# Patient Record
Sex: Male | Born: 1945 | Race: White | Hispanic: No | Marital: Married | Smoking: Former smoker
Health system: Southern US, Community
[De-identification: ages and names within clinical notes are randomized; demographics above are authoritative.]

## PROBLEM LIST (undated history)

## (undated) DIAGNOSIS — E782 Mixed hyperlipidemia: Secondary | ICD-10-CM

## (undated) DIAGNOSIS — E119 Type 2 diabetes mellitus without complications: Secondary | ICD-10-CM

## (undated) DIAGNOSIS — I10 Essential (primary) hypertension: Secondary | ICD-10-CM

---

## 2012-12-17 ENCOUNTER — Ambulatory Visit: Payer: Self-pay | Admitting: Physical Medicine and Rehabilitation

## 2013-02-04 HISTORY — PX: BACK SURGERY: SHX140

## 2019-01-30 ENCOUNTER — Emergency Department
Admission: EM | Admit: 2019-01-30 | Discharge: 2019-01-30 | Disposition: A | Discharge status: Home / Self Care | Payer: BC Managed Care – PPO | Attending: Emergency Medicine | Admitting: Emergency Medicine

## 2019-01-30 ENCOUNTER — Other Ambulatory Visit: Payer: Self-pay

## 2019-01-30 ENCOUNTER — Encounter: Payer: Self-pay | Admitting: Emergency Medicine

## 2019-01-30 DIAGNOSIS — E119 Type 2 diabetes mellitus without complications: Secondary | ICD-10-CM | POA: Insufficient documentation

## 2019-01-30 DIAGNOSIS — H81393 Other peripheral vertigo, bilateral: Secondary | ICD-10-CM

## 2019-01-30 DIAGNOSIS — I10 Essential (primary) hypertension: Secondary | ICD-10-CM | POA: Insufficient documentation

## 2019-01-30 DIAGNOSIS — R42 Dizziness and giddiness: Secondary | ICD-10-CM | POA: Diagnosis present

## 2019-01-30 HISTORY — DX: Essential (primary) hypertension: I10

## 2019-01-30 HISTORY — DX: Type 2 diabetes mellitus without complications: E11.9

## 2019-01-30 LAB — CBC
HCT: 40.7 % (ref 39.0–52.0)
Hemoglobin: 13.7 g/dL (ref 13.0–17.0)
MCH: 29.7 pg (ref 26.0–34.0)
MCHC: 33.7 g/dL (ref 30.0–36.0)
MCV: 88.3 fL (ref 80.0–100.0)
Platelets: 269 10*3/uL (ref 150–400)
RBC: 4.61 MIL/uL (ref 4.22–5.81)
RDW: 13.2 % (ref 11.5–15.5)
WBC: 8.3 10*3/uL (ref 4.0–10.5)
nRBC: 0 % (ref 0.0–0.2)

## 2019-01-30 LAB — BASIC METABOLIC PANEL
Anion gap: 10 (ref 5–15)
BUN: 15 mg/dL (ref 8–23)
CO2: 26 mmol/L (ref 22–32)
Calcium: 9.2 mg/dL (ref 8.9–10.3)
Chloride: 101 mmol/L (ref 98–111)
Creatinine, Ser: 1.14 mg/dL (ref 0.61–1.24)
GFR calc Af Amer: >60 mL/min (ref >60)
GFR calc non Af Amer: >60 mL/min (ref >60)
Glucose, Bld: 169 mg/dL — ABNORMAL HIGH (ref 70–99)
Potassium: 4.2 mmol/L (ref 3.5–5.1)
Sodium: 137 mmol/L (ref 135–145)

## 2019-01-30 LAB — URINALYSIS, COMPLETE (UACMP) WITH MICROSCOPIC
Bacteria, UA: NONE SEEN
Bilirubin Urine: NEGATIVE
Glucose, UA: NEGATIVE mg/dL
Hgb urine dipstick: NEGATIVE
Ketones, ur: NEGATIVE mg/dL
Leukocytes,Ua: NEGATIVE
Nitrite: NEGATIVE
Protein, ur: NEGATIVE mg/dL
Specific Gravity, Urine: 1.004 — ABNORMAL LOW (ref 1.005–1.030)
Squamous Epithelial / HPF: NONE SEEN (ref 0–5)
pH: 7 (ref 5.0–8.0)

## 2019-01-30 MED ORDER — MECLIZINE HCL 12.5 MG PO TABS: 12.5000 mg | ORAL_TABLET | Freq: Three times a day (TID) | ORAL | 0 refills | Status: DC | PRN

## 2019-01-30 NOTE — Discharge Instructions (Signed)
Your work-up was reassuring.  We started you on a low-dose of meclizine to help with the dizziness.  However he should try to make slower position changes.  You may call ENT to follow-up.  Discussed with your primary care doctor about your difficulties with sleep.

## 2019-01-30 NOTE — ED Provider Notes (Signed)
[facility] Emergency Department Provider Note  ____________________________________________   First MD Initiated Contact with Patient 01/30/19 2019     (approximate)  I have reviewed the triage vital signs and the nursing notes.   HISTORY  Chief Complaint Dizziness    HPI Derek [name] is a 73 y.o. male with diabetes and hypertension who presents with dizziness.  Patient was having some dizziness for the past 2 weeks.  Patient describes the dizziness as coming on when he sits up or gets out of bed fast and it feels like the room is spinning, intermittent lasting few seconds and then goes away.  He also endorses it comes on when he is leaning over.  Being at rest makes it feel better.  He denies any chest pain, shortness of breath, abdominal pain, urinary symptoms.  Denies any difficulties with walking.  He does endorse a lot of stress with working 9 hours a day as well as taking care of his wife who has been battling cancer for multiple years and had a recent hip fracture.  He says he only sleeps 4 hours a night and he feels like sometimes his eyes are just heavy in nature.  He denies any SI.      Past Medical History:  Diagnosis Date  . Diabetes mellitus without complication ([place])   . Hypertension     There are no active problems to display for this patient.   History reviewed. No pertinent surgical history.  Prior to Admission medications   Not on File    Allergies Patient has no known allergies.  No family history on file.  Social History Former smoker, denies alcohol daily  Review of Systems Constitutional: No fever/chills Eyes: No visual changes. ENT: No sore throat.  Positive for dizziness Cardiovascular: Denies chest pain. Respiratory: Denies shortness of breath. Gastrointestinal: No abdominal pain.  No nausea, no vomiting.  No diarrhea.  No constipation. Genitourinary: Negative for dysuria. Musculoskeletal: Negative for back  pain. Skin: Negative for rash. Neurological: Negative for headaches, focal weakness or numbness. All other ROS negative ____________________________________________   PHYSICAL EXAM:  VITAL SIGNS: ED Triage Vitals  Enc Vitals Group     BP 01/30/19 1545 (!) 147/68     Pulse Rate 01/30/19 1545 82     Resp --      Temp 01/30/19 1545 99.5 F (37.5 C)     Temp Source 01/30/19 1545 Oral     SpO2 01/30/19 1545 95 %     Weight 01/30/19 1546 250 lb (113.4 kg)     Height 01/30/19 1546 5\' 9"  (1.753 m)     Head Circumference --      Peak Flow --      Pain Score 01/30/19 1552 0     Pain Loc --      Pain Edu? --      Excl. in [place]? --     Constitutional: Alert and oriented. Well appearing and in no acute distress. Eyes: Conjunctivae are normal. EOMI. Head: Atraumatic.  TMs are clear Nose: No congestion/rhinnorhea. Mouth/Throat: Mucous membranes are moist.   Neck: No stridor. Trachea Midline. FROM Cardiovascular: Normal rate, regular rhythm. Grossly normal heart sounds.  Good peripheral circulation. Respiratory: Normal respiratory effort.  No retractions. Lungs CTAB. Gastrointestinal: Soft and nontender. No distention. No abdominal bruits.  Musculoskeletal: No lower extremity tenderness nor edema.  No joint effusions. Neurologic: Cranial nerves II through XII are intact.  Ambulates normal.  Normal heel-to-shin.  Normal normal finger-to-nose.  Negative Romberg sign Skin:  Skin is warm, dry and intact. No rash noted. Psychiatric: Mood and affect are normal. Speech and behavior are normal. GU: Deferred   ____________________________________________   LABS (all labs ordered are listed, but only abnormal results are displayed)  Labs Reviewed  BASIC METABOLIC PANEL - Abnormal; Notable for the following components:      Result Value   Glucose, Bld 169 (*)    All other components within normal limits  URINALYSIS, COMPLETE (UACMP) WITH MICROSCOPIC - Abnormal; Notable for the following  components:   Color, Urine STRAW (*)    APPearance CLEAR (*)    Specific Gravity, Urine 1.004 (*)    All other components within normal limits  CBC   ____________________________________________   ED ECG REPORT I, Concha SeMary E Kavonte Bearse, the attending physician, personally viewed and interpreted this ECG.  EKG is sinus rate of 80, no ST elevation, no T wave inversion, normal intervals _ PROCEDURES  Procedure(s) performed (including Critical Care):  Procedures   ____________________________________________   INITIAL IMPRESSION / ASSESSMENT AND PLAN / ED COURSE  Derek [name] was evaluated in Emergency Department on 01/30/2019 for the symptoms described in the history of present illness. He was evaluated in the context of the global COVID-19 pandemic, which necessitated consideration that the patient might be at risk for infection with the SARS-CoV-2 virus that causes COVID-19. Institutional protocols and algorithms that pertain to the evaluation of patients at risk for COVID-19 are in a state of rapid change based on information released by regulatory bodies including the CDC and federal and state organizations. These policies and algorithms were followed during the patient's care in the ED.    Patient is a very well-appearing 73 year old who presents with dizziness for 2 weeks that is positional in nature.  Noted to have otitis media on examination.  Low suspicion for ACS given no chest pain not exertional.  Neuro exam is reassuring and low suspicion for posterior stroke.  Will get labs to evaluate for electrolyte abnormalities, UTI.  Patient does endorse significant new stressors in his life and getting a lot less sleep than normal.  This could also be contributing to his symptoms.  Patient denies SI requiring emergent psychiatric consult.  Discussed with patient trying melatonin for sleep and to follow-up with his primary care doctor.  Patient denies any head trauma to suggest bleeds and is not on  any blood thinners.  He has a normal neuro exam so low suspicion for intracranial mass.   Labs are notable for slightly elevated glucose at 169.  White count is normal making infection less likely.  Urine is without evidence of UTI.  We will give patient a short course of meclizine to see if that helps with his symptoms and give ENT referral.  Patient currently denies feeling dizzy at this time.  I discussed the provisional nature of ED diagnosis, the treatment so far, the ongoing plan of care, follow up appointments and return precautions with the patient and any family or support people present. They expressed understanding and agreed with the plan, discharged home.    ____________________________________________   FINAL CLINICAL IMPRESSION(S) / ED DIAGNOSES   Final diagnoses:  Peripheral vertigo of both ears      MEDICATIONS GIVEN DURING THIS VISIT:  Medications - No data to display   ED Discharge Orders         Ordered    meclizine (ANTIVERT) 12.5 MG tablet  3 times daily PRN  01/30/19 2104           Note:  This document was prepared using Dragon voice recognition software and may include unintentional dictation errors.   Concha SeFunke, Timothea Bodenheimer E, MD 01/30/19 2107

## 2019-01-30 NOTE — ED Notes (Signed)
Patient reports feeling "dizzy" headed for the past week.  Reports feels like he isn't sleeping well.  Patient denies any problems with ambulating.  Reports stressors due to wife's health.  ER provider at bedside.

## 2019-01-30 NOTE — ED Triage Notes (Signed)
Pt arrived via POV with reports of dizziness for several days.  Pt reports he has also had some trouble sleeping as well. No distress noted on arrival, pt alert and oriented, ambulatory without assistance.   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

## 2021-04-04 ENCOUNTER — Encounter: Payer: Self-pay | Admitting: Emergency Medicine

## 2021-04-04 ENCOUNTER — Emergency Department: Payer: Medicare HMO

## 2021-04-04 ENCOUNTER — Other Ambulatory Visit: Payer: Self-pay

## 2021-04-04 ENCOUNTER — Emergency Department
Admission: EM | Admit: 2021-04-04 | Discharge: 2021-04-04 | Disposition: A | Discharge status: Home / Self Care | Payer: Medicare HMO | Attending: Emergency Medicine | Admitting: Emergency Medicine

## 2021-04-04 DIAGNOSIS — Z87891 Personal history of nicotine dependence: Secondary | ICD-10-CM | POA: Diagnosis not present

## 2021-04-04 DIAGNOSIS — R42 Dizziness and giddiness: Secondary | ICD-10-CM | POA: Insufficient documentation

## 2021-04-04 DIAGNOSIS — I10 Essential (primary) hypertension: Secondary | ICD-10-CM | POA: Diagnosis not present

## 2021-04-04 DIAGNOSIS — E119 Type 2 diabetes mellitus without complications: Secondary | ICD-10-CM | POA: Insufficient documentation

## 2021-04-04 LAB — CBC WITH DIFFERENTIAL/PLATELET
Abs Immature Granulocytes: 0.04 10*3/uL (ref 0.00–0.07)
Basophils Absolute: 0.1 10*3/uL (ref 0.0–0.1)
Basophils Relative: 1 %
Eosinophils Absolute: 0.2 10*3/uL (ref 0.0–0.5)
Eosinophils Relative: 2 %
HCT: 42.3 % (ref 39.0–52.0)
Hemoglobin: 14.7 g/dL (ref 13.0–17.0)
Immature Granulocytes: 1 %
Lymphocytes Relative: 23 %
Lymphs Abs: 1.9 10*3/uL (ref 0.7–4.0)
MCH: 31.1 pg (ref 26.0–34.0)
MCHC: 34.8 g/dL (ref 30.0–36.0)
MCV: 89.4 fL (ref 80.0–100.0)
Monocytes Absolute: 0.8 10*3/uL (ref 0.1–1.0)
Monocytes Relative: 10 %
Neutro Abs: 5.3 10*3/uL (ref 1.7–7.7)
Neutrophils Relative %: 63 %
Platelets: 245 10*3/uL (ref 150–400)
RBC: 4.73 MIL/uL (ref 4.22–5.81)
RDW: 12.8 % (ref 11.5–15.5)
WBC: 8.4 10*3/uL (ref 4.0–10.5)
nRBC: 0 % (ref 0.0–0.2)

## 2021-04-04 LAB — COMPREHENSIVE METABOLIC PANEL
ALT: 14 U/L (ref 0–44)
AST: 19 U/L (ref 15–41)
Albumin: 4.3 g/dL (ref 3.5–5.0)
Alkaline Phosphatase: 64 U/L (ref 38–126)
Anion gap: 10 (ref 5–15)
BUN: 20 mg/dL (ref 8–23)
CO2: 28 mmol/L (ref 22–32)
Calcium: 9.4 mg/dL (ref 8.9–10.3)
Chloride: 98 mmol/L (ref 98–111)
Creatinine, Ser: 0.85 mg/dL (ref 0.61–1.24)
GFR, Estimated: >60 mL/min (ref >60)
Glucose, Bld: 192 mg/dL — ABNORMAL HIGH (ref 70–99)
Potassium: 3.8 mmol/L (ref 3.5–5.1)
Sodium: 136 mmol/L (ref 135–145)
Total Bilirubin: 0.9 mg/dL (ref 0.3–1.2)
Total Protein: 6.9 g/dL (ref 6.5–8.1)

## 2021-04-04 LAB — TROPONIN I (HIGH SENSITIVITY): Troponin I (High Sensitivity): 7 ng/L (ref <18)

## 2021-04-04 MED ORDER — ONDANSETRON 4 MG PO TBDP: 4.0000 mg | ORAL_TABLET | Freq: Three times a day (TID) | ORAL | 0 refills | Status: DC | PRN

## 2021-04-04 MED ORDER — MECLIZINE HCL 25 MG PO TABS: 25.0000 mg | ORAL_TABLET | Freq: Three times a day (TID) | ORAL | 0 refills | Status: DC | PRN

## 2021-04-04 NOTE — Discharge Instructions (Signed)
1.  You may take medicines as needed for dizziness and nausea (Meclizine/Zofran #20). 2.  Drink plenty of water daily. 3.  Return to the ER for worsening symptoms, persistent vomiting, difficulty breathing or other concerns.

## 2021-04-04 NOTE — ED Provider Notes (Signed)
[facility] Emergency Department Provider Note   ____________________________________________   Event Date/Time   First MD Initiated Contact with Patient 04/04/21 [phone]     (approximate)  I have reviewed the triage vital signs and the nursing notes.   HISTORY  Chief Complaint Dizziness    HPI Derek [name] is a 75 y.o. male who presents to the ED from home with a chief complaint of dizziness.  Patient reports waking up from a nap and feeling like the room was spinning.  Symptoms worsen when he tried to get up and move his head from side to side.  Denies associated fever, vision changes, neck pain, cough, chest pain, shortness of breath, headache, abdominal pain, nausea or vomiting.  Denies recent illness or sinus issues.  Similar symptoms previously.  Admits he does not drink as much water during the day as he should.  He prefers to drink either sweet tea or [employer].     Past Medical History:  Diagnosis Date   Diabetes mellitus without complication (HCC)    Hypertension     There are no problems to display for this patient.   History reviewed. No pertinent surgical history.  Prior to Admission medications   Medication Sig Start Date End Date Taking? Authorizing Provider  meclizine (ANTIVERT) 25 MG tablet Take 1 tablet (25 mg total) by mouth 3 (three) times daily as needed for dizziness or nausea. 04/04/21  Yes Irean Hong, MD  ondansetron (ZOFRAN ODT) 4 MG disintegrating tablet Take 1 tablet (4 mg total) by mouth every 8 (eight) hours as needed for nausea or vomiting. 04/04/21  Yes Irean Hong, MD    Allergies Patient has no known allergies.  No family history on file.  Social History Social History   Tobacco Use   Smoking status: Former   Smokeless tobacco: Never  Building services engineer Use: Never used  Substance Use Topics   Alcohol use: Never   Drug use: Never    Review of Systems  Constitutional: No fever/chills Eyes: No  visual changes. ENT: No sore throat. Cardiovascular: Denies chest pain. Respiratory: Denies shortness of breath. Gastrointestinal: No abdominal pain.  No nausea, no vomiting.  No diarrhea.  No constipation. Genitourinary: Negative for dysuria. Musculoskeletal: Negative for back pain. Skin: Negative for rash. Neurological: Positive for dizziness.  Negative for headaches, focal weakness or numbness.   ____________________________________________   PHYSICAL EXAM:  VITAL SIGNS: ED Triage Vitals  Enc Vitals Group     BP 04/04/21 0115 (!) 141/86     Pulse Rate 04/04/21 0115 84     Resp 04/04/21 0115 18     Temp 04/04/21 0115 98.6 F (37 C)     Temp Source 04/04/21 0115 Oral     SpO2 04/04/21 0115 96 %     Weight 04/04/21 0117 220 lb (99.8 kg)     Height 04/04/21 0117 5\' 9"  (1.753 m)     Head Circumference --      Peak Flow --      Pain Score 04/04/21 0117 0     Pain Loc --      Pain Edu? --      Excl. in [place]? --     Constitutional: Alert and oriented. Well appearing and in no acute distress. Eyes: Conjunctivae are normal. PERRL. EOMI. Head: Atraumatic. Nose: No congestion/rhinnorhea. Mouth/Throat: Mucous membranes are moist.   Neck: No stridor.  No carotid bruits.  Supple neck without meningismus. Cardiovascular: Normal  rate, regular rhythm. Grossly normal heart sounds.  Good peripheral circulation. Respiratory: Normal respiratory effort.  No retractions. Lungs CTAB. Gastrointestinal: Soft and nontender. No distention. No abdominal bruits. No CVA tenderness. Musculoskeletal: No lower extremity tenderness nor edema.  No joint effusions. Neurologic: Alert and oriented x3.  CN II-XII grossly intact.  Normal speech and language.  5/5 motor strength and sensation all extremities.  No gross focal neurologic deficits are appreciated.  Skin:  Skin is warm, dry and intact. No rash noted. Psychiatric: Mood and affect are normal. Speech and behavior are  normal.  ____________________________________________   LABS (all labs ordered are listed, but only abnormal results are displayed)  Labs Reviewed  COMPREHENSIVE METABOLIC PANEL - Abnormal; Notable for the following components:      Result Value   Glucose, Bld 192 (*)    All other components within normal limits  CBC WITH DIFFERENTIAL/PLATELET  URINALYSIS, ROUTINE W REFLEX MICROSCOPIC  TROPONIN I (HIGH SENSITIVITY)  TROPONIN I (HIGH SENSITIVITY)   ____________________________________________  EKG  ED ECG REPORT I, Ledell Codrington J, the attending physician, personally viewed and interpreted this ECG.   Date: 04/04/2021  EKG Time: 0119  Rate: 83  Rhythm: normal sinus rhythm  Axis: Normal  Intervals:none  ST&T Change: Nonspecific  ____________________________________________  RADIOLOGY I, Elnita Surprenant J, personally viewed and evaluated these images (plain radiographs) as part of my medical decision making, as well as reviewing the written report by the radiologist.  ED MD interpretation: No acute ICH, sinus polyp  Official radiology report(s): CT Head Wo Contrast  Result Date: 04/04/2021 CLINICAL DATA:  Vertigo. EXAM: CT HEAD WITHOUT CONTRAST TECHNIQUE: Contiguous axial images were obtained from the base of the skull through the vertex without intravenous contrast. COMPARISON:  None. FINDINGS: Brain: There is mild cerebral atrophy with widening of the extra-axial spaces and ventricular dilatation. There are areas of decreased attenuation within the white matter tracts of the supratentorial brain, consistent with microvascular disease changes. Vascular: No hyperdense vessel or unexpected calcification. Skull: Normal. Negative for fracture or focal lesion. Sinuses/Orbits: A 1.7 cm x 1.1 cm right maxillary sinus polyp versus mucous retention cyst is seen. Other: None. IMPRESSION: 1. Generalized cerebral atrophy. 2. No acute intracranial abnormality. 3. 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 Electronically Signed   By: Aram Candela M.D.   On: 04/04/2021 01:42    ____________________________________________   PROCEDURES  Procedure(s) performed (including Critical Care):  Procedures   ____________________________________________   INITIAL IMPRESSION / ASSESSMENT AND PLAN / ED COURSE  As part of my medical decision making, I reviewed the following data within the electronic MEDICAL RECORD NUMBER History obtained from family, Nursing notes reviewed and incorporated, Labs reviewed, EKG interpreted, Old chart reviewed, Radiograph reviewed, and Notes from prior ED visits     75 year old male presenting with dizziness.  Differential diagnosis includes but is not limited to TIA, CVA, ACS, BPV, infectious, electrolyte abnormalities, etc.  Laboratory results unremarkable.  At the time of our interview and examination, all symptoms have resolved.  Patient feels back to baseline and is eager for discharge home.  Will discharge home with as needed prescriptions for meclizine and Zofran.  Strict return precautions given.  Patient and daughter verbalize understanding agrees with plan of care.      ____________________________________________   FINAL CLINICAL IMPRESSION(S) / ED DIAGNOSES  Final diagnoses:  Dizziness  Vertigo     ED Discharge Orders          Ordered    meclizine (  ANTIVERT) 25 MG tablet  3 times daily PRN        04/04/21 0439    ondansetron (ZOFRAN ODT) 4 MG disintegrating tablet  Every 8 hours PRN        04/04/21 0439             Note:  This document was prepared using Dragon voice recognition software and may include unintentional dictation errors.    Irean Hong, MD 04/04/21 [phone]

## 2021-04-04 NOTE — ED Triage Notes (Signed)
Pt to triage via w/c with no distress noted; reports onset dizziness tonight with no accomp symptoms; denies hx of same; denies any recent illness

## 2021-04-24 ENCOUNTER — Encounter: Payer: Self-pay | Admitting: *Deleted

## 2022-01-08 ENCOUNTER — Encounter: Payer: Self-pay | Admitting: Ophthalmology

## 2022-01-09 ENCOUNTER — Other Ambulatory Visit: Payer: Self-pay

## 2022-01-09 ENCOUNTER — Encounter: Payer: Self-pay | Admitting: Ophthalmology

## 2022-01-09 NOTE — Discharge Instructions (Signed)

## 2022-01-14 ENCOUNTER — Ambulatory Visit: Payer: Medicare HMO | Admitting: Anesthesiology

## 2022-01-14 ENCOUNTER — Other Ambulatory Visit: Payer: Self-pay

## 2022-01-14 ENCOUNTER — Ambulatory Visit
Admission: RE | Admit: 2022-01-14 | Discharge: 2022-01-14 | Disposition: A | Discharge status: Home / Self Care | Payer: Medicare HMO | Source: Ambulatory Visit | Attending: Ophthalmology | Admitting: Ophthalmology

## 2022-01-14 ENCOUNTER — Encounter
Admission: RE | Disposition: A | Discharge status: Home / Self Care | Payer: Self-pay | Source: Ambulatory Visit | Attending: Ophthalmology

## 2022-01-14 DIAGNOSIS — Z6835 Body mass index (BMI) 35.0-35.9, adult: Secondary | ICD-10-CM | POA: Diagnosis not present

## 2022-01-14 DIAGNOSIS — R7303 Prediabetes: Secondary | ICD-10-CM | POA: Diagnosis not present

## 2022-01-14 DIAGNOSIS — H2511 Age-related nuclear cataract, right eye: Secondary | ICD-10-CM | POA: Insufficient documentation

## 2022-01-14 DIAGNOSIS — I10 Essential (primary) hypertension: Secondary | ICD-10-CM | POA: Diagnosis not present

## 2022-01-14 DIAGNOSIS — Z87891 Personal history of nicotine dependence: Secondary | ICD-10-CM | POA: Insufficient documentation

## 2022-01-14 DIAGNOSIS — E669 Obesity, unspecified: Secondary | ICD-10-CM | POA: Insufficient documentation

## 2022-01-14 HISTORY — PX: CATARACT EXTRACTION W/PHACO: SHX586

## 2022-01-14 HISTORY — DX: Mixed hyperlipidemia: E78.2

## 2022-01-14 SURGERY — PHACOEMULSIFICATION, CATARACT, WITH IOL INSERTION
Anesthesia: Monitor Anesthesia Care | Site: Eye | Laterality: Right

## 2022-01-14 MED ORDER — SIGHTPATH DOSE#1 BSS IO SOLN
INTRAOCULAR | Status: DC | PRN
  Administered 2022-01-14: 138 mL via OPHTHALMIC

## 2022-01-14 MED ORDER — LACTATED RINGERS IV SOLN: INTRAVENOUS | Status: DC

## 2022-01-14 MED ORDER — SIGHTPATH DOSE#1 BSS IO SOLN
INTRAOCULAR | Status: DC | PRN
  Administered 2022-01-14: 15 mL

## 2022-01-14 MED ORDER — MOXIFLOXACIN HCL 0.5 % OP SOLN
OPHTHALMIC | Status: DC | PRN
  Administered 2022-01-14: 0.2 mL via OPHTHALMIC

## 2022-01-14 MED ORDER — FENTANYL CITRATE (PF) 100 MCG/2ML IJ SOLN
INTRAMUSCULAR | Status: DC | PRN
  Administered 2022-01-14: 50 ug via INTRAVENOUS

## 2022-01-14 MED ORDER — SIGHTPATH DOSE#1 NA HYALUR & NA CHOND-NA HYALUR IO KIT
PACK | INTRAOCULAR | Status: DC | PRN
  Administered 2022-01-14: 1 via OPHTHALMIC

## 2022-01-14 MED ORDER — BRIMONIDINE TARTRATE-TIMOLOL 0.2-0.5 % OP SOLN
OPHTHALMIC | Status: DC | PRN
  Administered 2022-01-14: 1 [drp] via OPHTHALMIC

## 2022-01-14 MED ORDER — ARMC OPHTHALMIC DILATING DROPS
1.0000 | OPHTHALMIC | Status: DC | PRN
  Administered 2022-01-14 (×3): 1 via OPHTHALMIC

## 2022-01-14 MED ORDER — TETRACAINE HCL 0.5 % OP SOLN
1.0000 [drp] | OPHTHALMIC | Status: DC | PRN
Start: 2022-01-14 — End: 2022-01-14
  Administered 2022-01-14 (×3): 1 [drp] via OPHTHALMIC

## 2022-01-14 MED ORDER — MIDAZOLAM HCL 2 MG/2ML IJ SOLN
INTRAMUSCULAR | Status: DC | PRN
  Administered 2022-01-14 (×2): 1 mg via INTRAVENOUS

## 2022-01-14 MED ORDER — LIDOCAINE HCL (PF) 2 % IJ SOLN
INTRAOCULAR | Status: DC | PRN
  Administered 2022-01-14: 1 mL via INTRAOCULAR

## 2022-01-14 SURGICAL SUPPLY — 23 items
CANNULA ANT/CHMB 27G (MISCELLANEOUS) IMPLANT
CANNULA ANT/CHMB 27GA (MISCELLANEOUS) IMPLANT
CATARACT SUITE SIGHTPATH (MISCELLANEOUS) ×2 IMPLANT
DISSECTOR HYDRO NUCLEUS 50X22 (MISCELLANEOUS) ×2 IMPLANT
DRSG TEGADERM 2-3/8X2-3/4 SM (GAUZE/BANDAGES/DRESSINGS) ×2 IMPLANT
FEE CATARACT SUITE SIGHTPATH (MISCELLANEOUS) ×1 IMPLANT
GLOVE SURG SYN 7.5  E (GLOVE) ×1
GLOVE SURG SYN 7.5 E (GLOVE) ×1 IMPLANT
GLOVE SURG SYN 7.5 PF PI (GLOVE) ×1 IMPLANT
GLOVE SURG SYN 8.5  E (GLOVE) ×1
GLOVE SURG SYN 8.5 E (GLOVE) ×1 IMPLANT
GLOVE SURG SYN 8.5 PF PI (GLOVE) ×1 IMPLANT
LENS IOL TECNIS EYHANCE 25.0 (Intraocular Lens) ×1 IMPLANT
NDL FILTER BLUNT 18X1 1/2 (NEEDLE) IMPLANT
NEEDLE FILTER BLUNT 18X 1/2SAF (NEEDLE)
NEEDLE FILTER BLUNT 18X1 1/2 (NEEDLE) IMPLANT
PACK VIT ANT 23G (MISCELLANEOUS) IMPLANT
RING MALYGIN (MISCELLANEOUS) IMPLANT
SUT ETHILON 10-0 CS-B-6CS-B-6 (SUTURE)
SUTURE EHLN 10-0 CS-B-6CS-B-6 (SUTURE) IMPLANT
SYR 3ML LL SCALE MARK (SYRINGE) IMPLANT
SYR 5ML LL (SYRINGE) IMPLANT
WATER STERILE IRR 250ML POUR (IV SOLUTION) ×2 IMPLANT

## 2022-01-14 NOTE — Transfer of Care (Signed)
Immediate Anesthesia Transfer of Care Note  Patient: Derek [name]  Procedure(s) Performed: CATARACT EXTRACTION PHACO AND INTRAOCULAR LENS PLACEMENT (IOC) RIGHT DIABETIC (Right: Eye)  Patient Location: PACU  Anesthesia Type: MAC  Level of Consciousness: awake, alert  and patient cooperative  Airway and Oxygen Therapy: Patient Spontanous Breathing and Patient connected to supplemental oxygen  Post-op Assessment: Post-op Vital signs reviewed, Patient's Cardiovascular Status Stable, Respiratory Function Stable, Patent Airway and No signs of Nausea or vomiting  Post-op Vital Signs: Reviewed and stable  Complications: No notable events documented.

## 2022-01-14 NOTE — Anesthesia Preprocedure Evaluation (Signed)
Anesthesia Evaluation  Patient identified by MRN, date of birth, ID band Patient awake    Reviewed: Allergy & Precautions, NPO status , Patient's Chart, lab work & pertinent test results  Airway Mallampati: II  TM Distance: >3 FB Neck ROM: Full    Dental no notable dental hx.    Pulmonary former smoker,    Pulmonary exam normal        Cardiovascular hypertension, Normal cardiovascular exam     Neuro/Psych negative psych ROS   GI/Hepatic negative GI ROS, Neg liver ROS,   Endo/Other  diabetes (Patient states prediabetes, not taking medications)BMI 35  Renal/GU      Musculoskeletal negative musculoskeletal ROS (+)   Abdominal (+) + obese,   Peds  Hematology negative hematology ROS (+)   Anesthesia Other Findings   Reproductive/Obstetrics                             Anesthesia Physical Anesthesia Plan  ASA: 2  Anesthesia Plan: MAC   Post-op Pain Management: Minimal or no pain anticipated   Induction: Intravenous  PONV Risk Score and Plan: 1 and TIVA, Midazolam and Treatment may vary due to age or medical condition  Airway Management Planned: Natural Airway and Nasal Cannula  Additional Equipment: None  Intra-op Plan:   Post-operative Plan:   Informed Consent: I have reviewed the patients History and Physical, chart, labs and discussed the procedure including the risks, benefits and alternatives for the proposed anesthesia with the patient or authorized representative who has indicated his/her understanding and acceptance.     Dental advisory given  Plan Discussed with: CRNA  Anesthesia Plan Comments:         Anesthesia Quick Evaluation

## 2022-01-14 NOTE — Anesthesia Postprocedure Evaluation (Signed)
Anesthesia Post Note  Patient: Derek [name]  Procedure(s) Performed: CATARACT EXTRACTION PHACO AND INTRAOCULAR LENS PLACEMENT (IOC) RIGHT DIABETIC (Right: Eye)     Patient location during evaluation: PACU Anesthesia Type: MAC Level of consciousness: awake and alert Pain management: pain level controlled Vital Signs Assessment: post-procedure vital signs reviewed and stable Respiratory status: spontaneous breathing and nonlabored ventilation Cardiovascular status: blood pressure returned to baseline Postop Assessment: no apparent nausea or vomiting Anesthetic complications: no   No notable events documented.  Jeziel Hoffmann [employer]

## 2022-01-14 NOTE — Op Note (Signed)
OPERATIVE NOTE  Derek [name] [id number] 01/14/2022   PREOPERATIVE DIAGNOSIS: Nuclear sclerotic cataract right eye. H25.11   POSTOPERATIVE DIAGNOSIS: Nuclear sclerotic cataract right eye. H25.11   PROCEDURE:  Phacoemusification with posterior chamber intraocular lens placement of the right eye  Ultrasound time: Procedure(s) with comments: CATARACT EXTRACTION PHACO AND INTRAOCULAR LENS PLACEMENT (IOC) RIGHT DIABETIC (Right) - 27.29 2:44.3  LENS:   Implant Name Type Inv. Item Serial No. Manufacturer Lot No. LRB No. Used Action  LENS IOL TECNIS EYHANCE 25.0 - [id number] Intraocular Lens LENS IOL TECNIS EYHANCE 25.0 [id number] [employer]  Right 1 Implanted      SURGEON:  Julious Payer. Rolley Sims, MD   ANESTHESIA:  Topical with tetracaine drops, augmented with 1% preservative-free intracameral lidocaine.   COMPLICATIONS:  None.   DESCRIPTION OF PROCEDURE:  The patient was identified in the holding room and transported to the operating room and placed in the supine position under the operating microscope.  The right eye was identified as the operative eye, which was prepped and draped in the usual sterile ophthalmic fashion.   A 1 millimeter clear-corneal paracentesis was made superotemporally. Preservative-free 1% lidocaine mixed with 1:1,000 bisulfite-free aqueous solution of epinephrine was injected into the anterior chamber. The anterior chamber was then filled with Viscoat viscoelastic. A 2.4 millimeter keratome was used to make a clear-corneal incision inferotemporally. A curvilinear capsulorrhexis was made with a cystotome and capsulorrhexis forceps. Balanced salt solution was used to hydrodissect and hydrodelineate the nucleus. Phacoemulsification was then used to remove the lens nucleus and epinucleus. The remaining cortex was then removed using the irrigation and aspiration handpiece. Provisc was then placed into the capsular bag to distend it for lens placement. A +25.00 DIB00 intraocular lens  was then injected into the capsular bag. The remaining viscoelastic was aspirated.   Wounds were hydrated with balanced salt solution.  The anterior chamber was inflated to a physiologic pressure with balanced salt solution.  No wound leaks were noted. Vigamox was injected intracamerally.  Timolol and Brimonidine drops were applied to the eye.  The patient was taken to the recovery room in stable condition without complications of anesthesia or surgery.  [name] 01/14/2022, 8:05 AM

## 2022-01-14 NOTE — Anesthesia Procedure Notes (Signed)
Procedure Name: MAC Date/Time: 01/14/2022 7:27 AM  Performed by: Dionne Bucy, CRNAPre-anesthesia Checklist: Patient identified, Emergency Drugs available, Suction available, Patient being monitored and Timeout performed Patient Re-evaluated:Patient Re-evaluated prior to induction Oxygen Delivery Method: Nasal cannula Placement Confirmation: positive ETCO2

## 2022-01-14 NOTE — H&P (Signed)
[facility] [place]   Primary Care Physician:  Marisue Ivan, MD Ophthalmologist: Dr. Deberah Pelton  Pre-Procedure History & Physical: HPI:  Derek [name] is a 76 y.o. male here for cataract surgery.   Past Medical History:  Diagnosis Date   Diabetes mellitus without complication (HCC)    Hypertension    Mixed hyperlipidemia     Past Surgical History:  Procedure Laterality Date   BACK SURGERY  02/04/2013   L1-L2    Prior to Admission medications   Medication Sig Start Date End Date Taking? Authorizing Provider  aspirin EC 81 MG tablet Take 81 mg by mouth daily. Swallow whole.   Yes [provider]  atorvastatin (LIPITOR) 40 MG tablet Take 40 mg by mouth daily.   Yes [provider]  Garlic 100 MG TABS Take by mouth.   Yes [provider]  lisinopril-hydrochlorothiazide (ZESTORETIC) 20-25 MG tablet Take 1 tablet by mouth daily.   Yes [provider]  meclizine (ANTIVERT) 25 MG tablet Take 1 tablet (25 mg total) by mouth 3 (three) times daily as needed for dizziness or nausea. 04/04/21  Yes Irean Hong, MD  Multiple Vitamin (MULTIVITAMIN) capsule Take 1 capsule by mouth daily.   Yes [provider]  ondansetron (ZOFRAN ODT) 4 MG disintegrating tablet Take 1 tablet (4 mg total) by mouth every 8 (eight) hours as needed for nausea or vomiting. 04/04/21  Yes Irean Hong, MD  pyridOXINE (VITAMIN B-6) 100 MG tablet Take 100 mg by mouth daily.   Yes [provider]  glipiZIDE (GLUCOTROL) 5 MG tablet Take 5 mg by mouth daily before breakfast. Patient not taking: Reported on 01/14/2022    [provider]    Allergies as of 11/09/2021   (No Known Allergies)    History reviewed. No pertinent family history.  Social History   Socioeconomic History   Marital status: Married    Spouse name: Not on file   Number of children: Not on file   Years of education: Not on file   Highest education level: Not on file   Occupational History   Not on file  Tobacco Use   Smoking status: Former   Smokeless tobacco: Never  Vaping Use   Vaping Use: Never used  Substance and Sexual Activity   Alcohol use: Never   Drug use: Never   Sexual activity: Not on file  Other Topics Concern   Not on file  Social History Narrative   Not on file   Social Determinants of Health   Financial Resource Strain: Not on file  Food Insecurity: Not on file  Transportation Needs: Not on file  Physical Activity: Not on file  Stress: Not on file  Social Connections: Not on file  Intimate Partner Violence: Not on file    Review of Systems: See HPI, otherwise negative ROS  Physical Exam: BP (!) 143/78   Pulse 72   Temp 97.7 F (36.5 C) (Temporal)   Resp 16   Ht 5\' 9"  (1.753 m)   Wt 109.8 kg   SpO2 96%   BMI 35.74 kg/m  General:   Alert, cooperative in NAD Head:  Normocephalic and atraumatic. Respiratory:  Normal work of breathing. Cardiovascular:  RRR  Impression/Plan: is here for cataract surgery.  Risks, benefits, limitations, and alternatives regarding cataract surgery have been reviewed with the patient.  Questions have been answered.  All parties agreeable.   [name], MD  01/14/2022, 7:16 AM

## 2022-01-15 ENCOUNTER — Encounter: Payer: Self-pay | Admitting: Ophthalmology

## 2022-01-17 ENCOUNTER — Encounter: Payer: Self-pay | Admitting: Ophthalmology

## 2022-01-22 NOTE — Discharge Instructions (Signed)

## 2022-01-24 ENCOUNTER — Encounter
Admission: RE | Disposition: A | Discharge status: Home / Self Care | Payer: Self-pay | Source: Home / Self Care | Attending: Ophthalmology

## 2022-01-24 ENCOUNTER — Ambulatory Visit: Payer: Medicare HMO | Admitting: Anesthesiology

## 2022-01-24 ENCOUNTER — Ambulatory Visit
Admission: RE | Admit: 2022-01-24 | Discharge: 2022-01-24 | Disposition: A | Discharge status: Home / Self Care | Payer: Medicare HMO | Attending: Ophthalmology | Admitting: Ophthalmology

## 2022-01-24 ENCOUNTER — Ambulatory Visit (AMBULATORY_SURGERY_CENTER): Payer: Medicare HMO | Admitting: Anesthesiology

## 2022-01-24 ENCOUNTER — Other Ambulatory Visit: Payer: Self-pay

## 2022-01-24 ENCOUNTER — Encounter: Payer: Self-pay | Admitting: Ophthalmology

## 2022-01-24 DIAGNOSIS — I10 Essential (primary) hypertension: Secondary | ICD-10-CM

## 2022-01-24 DIAGNOSIS — Z87891 Personal history of nicotine dependence: Secondary | ICD-10-CM

## 2022-01-24 DIAGNOSIS — E1136 Type 2 diabetes mellitus with diabetic cataract: Secondary | ICD-10-CM | POA: Diagnosis not present

## 2022-01-24 DIAGNOSIS — E119 Type 2 diabetes mellitus without complications: Secondary | ICD-10-CM

## 2022-01-24 DIAGNOSIS — H2512 Age-related nuclear cataract, left eye: Secondary | ICD-10-CM | POA: Insufficient documentation

## 2022-01-24 HISTORY — PX: CATARACT EXTRACTION W/PHACO: SHX586

## 2022-01-24 LAB — GLUCOSE, CAPILLARY: Glucose-Capillary: 189 mg/dL — ABNORMAL HIGH (ref 70–99)

## 2022-01-24 SURGERY — PHACOEMULSIFICATION, CATARACT, WITH IOL INSERTION
Anesthesia: Monitor Anesthesia Care | Site: Eye | Laterality: Left

## 2022-01-24 MED ORDER — BRIMONIDINE TARTRATE-TIMOLOL 0.2-0.5 % OP SOLN
OPHTHALMIC | Status: DC | PRN
  Administered 2022-01-24: 1 [drp] via OPHTHALMIC

## 2022-01-24 MED ORDER — LACTATED RINGERS IV SOLN: INTRAVENOUS | Status: DC

## 2022-01-24 MED ORDER — SIGHTPATH DOSE#1 BSS IO SOLN
INTRAOCULAR | Status: DC | PRN
  Administered 2022-01-24: 91 mL via OPHTHALMIC

## 2022-01-24 MED ORDER — ARMC OPHTHALMIC DILATING DROPS
1.0000 | OPHTHALMIC | Status: DC | PRN
  Administered 2022-01-24 (×3): 1 via OPHTHALMIC

## 2022-01-24 MED ORDER — MOXIFLOXACIN HCL 0.5 % OP SOLN
OPHTHALMIC | Status: DC | PRN
  Administered 2022-01-24: 0.2 mL via OPHTHALMIC

## 2022-01-24 MED ORDER — TETRACAINE HCL 0.5 % OP SOLN
1.0000 [drp] | OPHTHALMIC | Status: DC | PRN
Start: 2022-01-24 — End: 2022-01-24
  Administered 2022-01-24 (×3): 1 [drp] via OPHTHALMIC

## 2022-01-24 MED ORDER — LIDOCAINE HCL (PF) 2 % IJ SOLN
INTRAOCULAR | Status: DC | PRN
  Administered 2022-01-24: 4 mL via INTRAOCULAR

## 2022-01-24 MED ORDER — SIGHTPATH DOSE#1 NA HYALUR & NA CHOND-NA HYALUR IO KIT
PACK | INTRAOCULAR | Status: DC | PRN
  Administered 2022-01-24: 1 via OPHTHALMIC

## 2022-01-24 MED ORDER — SIGHTPATH DOSE#1 BSS IO SOLN
INTRAOCULAR | Status: DC | PRN
  Administered 2022-01-24: 15 mL via INTRAOCULAR

## 2022-01-24 MED ORDER — FENTANYL CITRATE (PF) 100 MCG/2ML IJ SOLN
INTRAMUSCULAR | Status: DC | PRN
  Administered 2022-01-24: 50 ug via INTRAVENOUS

## 2022-01-24 MED ORDER — MIDAZOLAM HCL 2 MG/2ML IJ SOLN
INTRAMUSCULAR | Status: DC | PRN
  Administered 2022-01-24: 1 mg via INTRAVENOUS

## 2022-01-24 SURGICAL SUPPLY — 14 items
CANNULA ANT/CHMB 27G (MISCELLANEOUS) IMPLANT
CANNULA ANT/CHMB 27GA (MISCELLANEOUS) IMPLANT
CATARACT SUITE SIGHTPATH (MISCELLANEOUS) ×2 IMPLANT
DISSECTOR HYDRO NUCLEUS 50X22 (MISCELLANEOUS) ×2 IMPLANT
DRSG TEGADERM 2-3/8X2-3/4 SM (GAUZE/BANDAGES/DRESSINGS) ×2 IMPLANT
FEE CATARACT SUITE SIGHTPATH (MISCELLANEOUS) ×1 IMPLANT
GLOVE SURG SYN 7.5  E (GLOVE) ×1
GLOVE SURG SYN 7.5 E (GLOVE) ×1 IMPLANT
GLOVE SURG SYN 7.5 PF PI (GLOVE) ×1 IMPLANT
GLOVE SURG SYN 8.5  E (GLOVE) ×1
GLOVE SURG SYN 8.5 E (GLOVE) ×1 IMPLANT
GLOVE SURG SYN 8.5 PF PI (GLOVE) ×1 IMPLANT
LENS IOL TECNIS EYHANCE 25.5 (Intraocular Lens) ×1 IMPLANT
WATER STERILE IRR 250ML POUR (IV SOLUTION) ×2 IMPLANT

## 2022-01-24 NOTE — Op Note (Signed)
OPERATIVE NOTE  Derek [name] [id number] 01/24/2022   PREOPERATIVE DIAGNOSIS: Nuclear sclerotic cataract left eye. H25.12   POSTOPERATIVE DIAGNOSIS: Nuclear sclerotic cataract left eye. H25.12   PROCEDURE:  Phacoemusification with posterior chamber intraocular lens placement of the left eye  Ultrasound time: Procedure(s): CATARACT EXTRACTION PHACO AND INTRAOCULAR LENS PLACEMENT (IOC) LEFT DIABETIC 19.04 01:44.7 (Left)  LENS:   Implant Name Type Inv. Item Serial No. Manufacturer Lot No. LRB No. Used Action  LENS IOL TECNIS EYHANCE 25.5 - [id number] Intraocular Lens LENS IOL TECNIS EYHANCE 25.5 [id number] [employer]  Left 1 Implanted      SURGEON:  Julious Payer. Rolley Sims, MD   ANESTHESIA:  Topical with tetracaine drops, augmented with 1% preservative-free intracameral lidocaine.   COMPLICATIONS:  None.   DESCRIPTION OF PROCEDURE:  The patient was identified in the holding room and transported to the operating room and placed in the supine position under the operating microscope.  The left eye was identified as the operative eye, which was prepped and draped in the usual sterile ophthalmic fashion.   A 1 millimeter clear-corneal paracentesis was made inferotemporally. Preservative-free 1% lidocaine mixed with 1:1,000 bisulfite-free aqueous solution of epinephrine was injected into the anterior chamber. The anterior chamber was then filled with Viscoat viscoelastic. A 2.4 millimeter keratome was used to make a clear-corneal incision superotemporally. A curvilinear capsulorrhexis was made with a cystotome and capsulorrhexis forceps. Balanced salt solution was used to hydrodissect and hydrodelineate the nucleus. Phacoemulsification was then used to remove the lens nucleus and epinucleus. The remaining cortex was then removed using the irrigation and aspiration handpiece. Provisc was then placed into the capsular bag to distend it for lens placement. A +25.50 DIB00 intraocular lens was then injected into  the capsular bag. The remaining viscoelastic was aspirated.   Wounds were hydrated with balanced salt solution.  The anterior chamber was inflated to a physiologic pressure with balanced salt solution.  No wound leaks were noted. Vigamox was injected intracamerally.  Timolol and Brimonidine drops were applied to the eye.  The patient was taken to the recovery room in stable condition without complications of anesthesia or surgery.  [name] 01/24/2022, 7:56 AM

## 2022-01-24 NOTE — Anesthesia Postprocedure Evaluation (Signed)
Anesthesia Post Note  Patient: Derek [name]  Procedure(s) Performed: CATARACT EXTRACTION PHACO AND INTRAOCULAR LENS PLACEMENT (IOC) LEFT DIABETIC 19.04 01:44.7 (Left: Eye)     Patient location during evaluation: PACU Anesthesia Type: MAC Level of consciousness: awake and alert Pain management: pain level controlled Vital Signs Assessment: post-procedure vital signs reviewed and stable Respiratory status: spontaneous breathing, nonlabored ventilation, respiratory function stable and patient connected to nasal cannula oxygen Cardiovascular status: stable and blood pressure returned to baseline Postop Assessment: no apparent nausea or vomiting Anesthetic complications: no   No notable events documented.  Adele Barthel Kevante Lunt

## 2022-01-24 NOTE — H&P (Signed)
[facility]   Primary Care Physician:  Marisue Ivan, MD Ophthalmologist: Dr. Deberah Pelton  Pre-Procedure History & Physical: HPI:  Derek [name] is a 76 y.o. male here for cataract surgery.   Past Medical History:  Diagnosis Date   Diabetes mellitus without complication (HCC)    Hypertension    Mixed hyperlipidemia     Past Surgical History:  Procedure Laterality Date   BACK SURGERY  02/04/2013   L1-L2   CATARACT EXTRACTION W/PHACO Right 01/14/2022   Procedure: CATARACT EXTRACTION PHACO AND INTRAOCULAR LENS PLACEMENT (IOC) RIGHT DIABETIC;  Surgeon: Estanislado Pandy, MD;  Location: [facility];  Service: Ophthalmology;  Laterality: Right;  27.29 2:44.3    Prior to Admission medications   Medication Sig Start Date End Date Taking? Authorizing Provider  ascorbic acid (VITAMIN C) 500 MG tablet Take 500 mg by mouth daily.   Yes [provider]  aspirin EC 81 MG tablet Take 81 mg by mouth daily. Swallow whole.   Yes [provider]  atorvastatin (LIPITOR) 40 MG tablet Take 40 mg by mouth daily.   Yes [provider]  Cholecalciferol (VITAMIN D3 PO) Take by mouth daily.   Yes [provider]  Garlic 100 MG TABS Take by mouth.   Yes [provider]  lisinopril-hydrochlorothiazide (ZESTORETIC) 20-25 MG tablet Take 1 tablet by mouth daily. 10-12.5   Yes [provider]  Misc Natural Products (BLOOD SUGAR 360) CAPS Take by mouth daily.   Yes [provider]  Multiple Vitamin (MULTIVITAMIN) capsule Take 1 capsule by mouth daily.   Yes [provider]  Omega-3 Fatty Acids (FISH OIL PO) Take by mouth daily.   Yes [provider]  pyridOXINE (VITAMIN B-6) 100 MG tablet Take 100 mg by mouth daily.   Yes [provider]  glipiZIDE (GLUCOTROL) 5 MG tablet Take 5 mg by mouth daily before breakfast. Patient not taking: Reported on 01/14/2022    [provider]  meclizine  (ANTIVERT) 25 MG tablet Take 1 tablet (25 mg total) by mouth 3 (three) times daily as needed for dizziness or nausea. Patient not taking: Reported on 01/17/2022 04/04/21   Irean Hong, MD  ondansetron (ZOFRAN ODT) 4 MG disintegrating tablet Take 1 tablet (4 mg total) by mouth every 8 (eight) hours as needed for nausea or vomiting. Patient not taking: Reported on 01/17/2022 04/04/21   Irean Hong, MD    Allergies as of 11/09/2021   (No Known Allergies)    History reviewed. No pertinent family history.  Social History   Socioeconomic History   Marital status: Married    Spouse name: Not on file   Number of children: Not on file   Years of education: Not on file   Highest education level: Not on file  Occupational History   Not on file  Tobacco Use   Smoking status: Former    Types: Cigarettes   Smokeless tobacco: Never   Tobacco comments:    "Quit when they got to 50 cents/pack"  Vaping Use   Vaping Use: Never used  Substance and Sexual Activity   Alcohol use: Not Currently   Drug use: Never   Sexual activity: Not on file  Other Topics Concern   Not on file  Social History Narrative   Not on file   Social Determinants of Health   Financial Resource Strain: Not on file  Food Insecurity: Not on file  Transportation Needs: Not on file  Physical Activity:  Not on file  Stress: Not on file  Social Connections: Not on file  Intimate Partner Violence: Not on file    Review of Systems: See HPI, otherwise negative ROS  Physical Exam: BP (!) 162/91   Pulse 78   Temp 97.7 F (36.5 C)   Ht 5\' 9"  (1.753 m)   Wt 109.3 kg   SpO2 96%   BMI 35.59 kg/m  General:   Alert, cooperative in NAD Head:  Normocephalic and atraumatic. Respiratory:  Normal work of breathing. Cardiovascular:  RRR  Impression/Plan: is here for cataract surgery.  Risks, benefits, limitations, and alternatives regarding cataract surgery have been reviewed with the patient.  Questions  have been answered.  All parties agreeable.   [name], MD  01/24/2022, 7:03 AM

## 2022-01-24 NOTE — Transfer of Care (Signed)
Immediate Anesthesia Transfer of Care Note  Patient: Derek [name]  Procedure(s) Performed: CATARACT EXTRACTION PHACO AND INTRAOCULAR LENS PLACEMENT (IOC) LEFT DIABETIC 19.04 01:44.7 (Left: Eye)  Patient Location: PACU  Anesthesia Type:MAC  Level of Consciousness: awake, alert  and oriented  Airway & Oxygen Therapy: Patient Spontanous Breathing  Post-op Assessment: Report given to RN and Post -op Vital signs reviewed and stable  Post vital signs: Reviewed and stable  Last Vitals:  Vitals Value Taken Time  BP 138/84 01/24/22 0758  Temp 36.3 C 01/24/22 0758  Pulse 83 01/24/22 0800  Resp 22 01/24/22 0800  SpO2 95 % 01/24/22 0800  Vitals shown include unvalidated device data.  Last Pain:  Vitals:   01/24/22 0758  PainSc: 0-No pain         Complications: No notable events documented.

## 2022-01-24 NOTE — Anesthesia Preprocedure Evaluation (Signed)
Anesthesia Evaluation  Patient identified by MRN, date of birth, ID band Patient awake    History of Anesthesia Complications Negative for: history of anesthetic complications  Airway Mallampati: III  TM Distance: >3 FB Neck ROM: Full    Dental no notable dental hx.    Pulmonary neg pulmonary ROS, former smoker,    Pulmonary exam normal        Cardiovascular Exercise Tolerance: Good hypertension, Pt. on medications Normal cardiovascular exam     Neuro/Psych negative neurological ROS     GI/Hepatic negative GI ROS, Neg liver ROS,   Endo/Other  diabetes, Type 2  Renal/GU negative Renal ROS     Musculoskeletal   Abdominal   Peds  Hematology   Anesthesia Other Findings   Reproductive/Obstetrics                             Anesthesia Physical Anesthesia Plan  ASA: 2  Anesthesia Plan: MAC   Post-op Pain Management: Minimal or no pain anticipated   Induction:   PONV Risk Score and Plan: 0 and Midazolam, Treatment may vary due to age or medical condition and TIVA  Airway Management Planned: Nasal Cannula and Natural Airway  Additional Equipment: None  Intra-op Plan:   Post-operative Plan:   Informed Consent: I have reviewed the patients History and Physical, chart, labs and discussed the procedure including the risks, benefits and alternatives for the proposed anesthesia with the patient or authorized representative who has indicated his/her understanding and acceptance.       Plan Discussed with: CRNA  Anesthesia Plan Comments:         Anesthesia Quick Evaluation

## 2022-01-25 ENCOUNTER — Encounter: Payer: Self-pay | Admitting: Ophthalmology

## 2022-03-25 IMAGING — CT CT HEAD W/O CM
3 series · 16 of 47 positions shown, 19 images · non-contrast
Comparison: None.

CLINICAL DATA: Vertigo.

EXAM:
CT HEAD WITHOUT CONTRAST
TECHNIQUE: Contiguous axial images were obtained from the base of the skull
through the vertex without intravenous contrast.

[Series 2: head wo · axial · 0.44mm/px · z∈[-70,+75]mm · 10 of 35 slices shown, 13 images]
[im 3/35  brain]
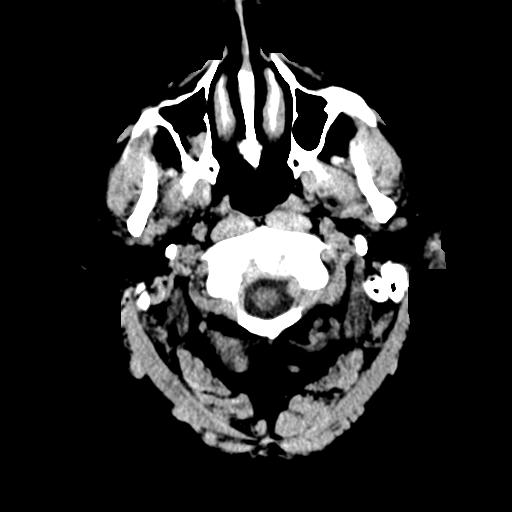
[im 3/35  bone]
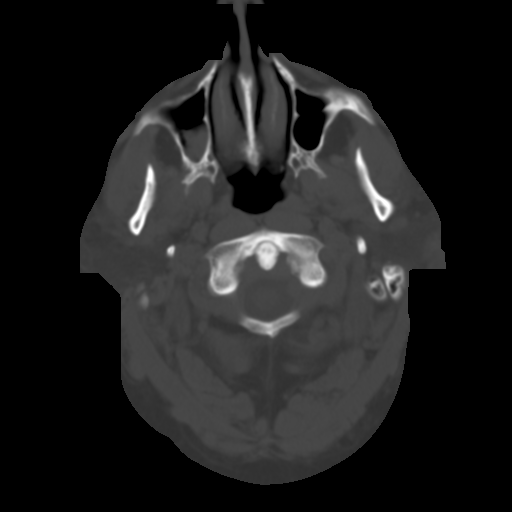
[im 6/35  brain]
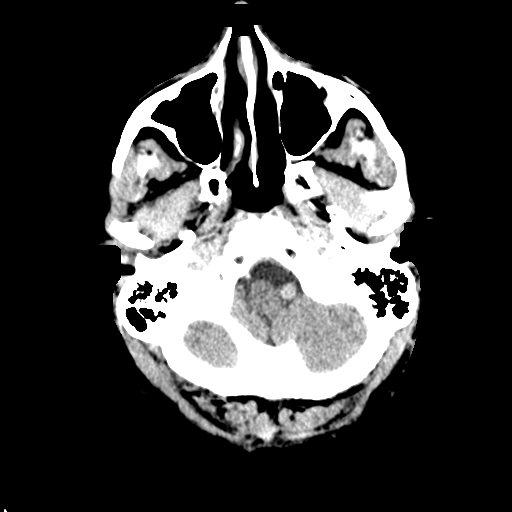
[im 10/35  brain]
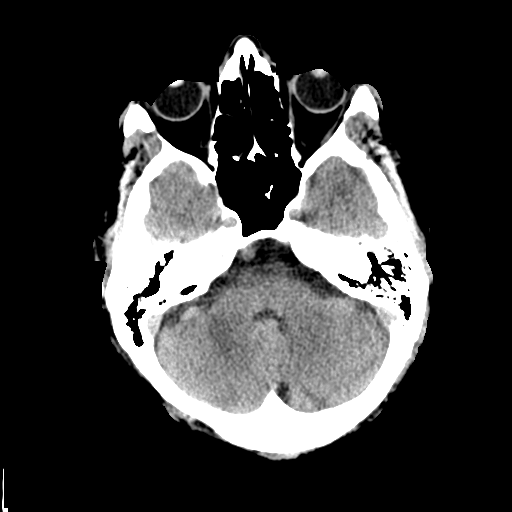
[im 12/35  brain]
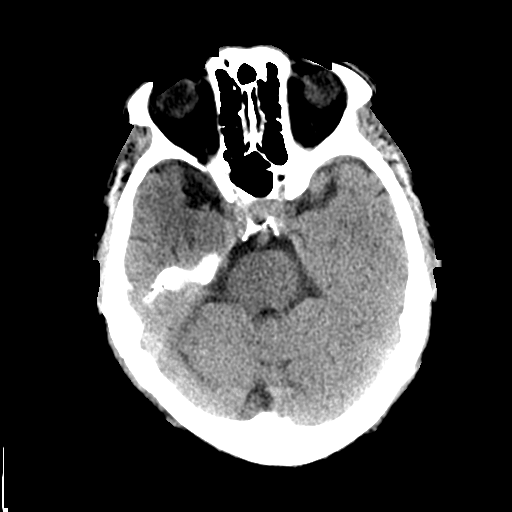
[im 16/35  brain]
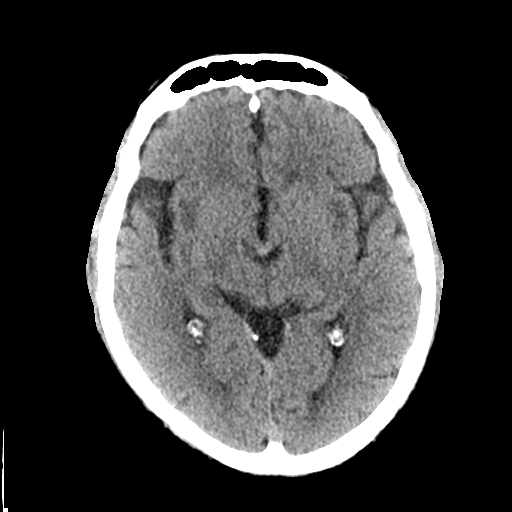
[im 16/35  bone]
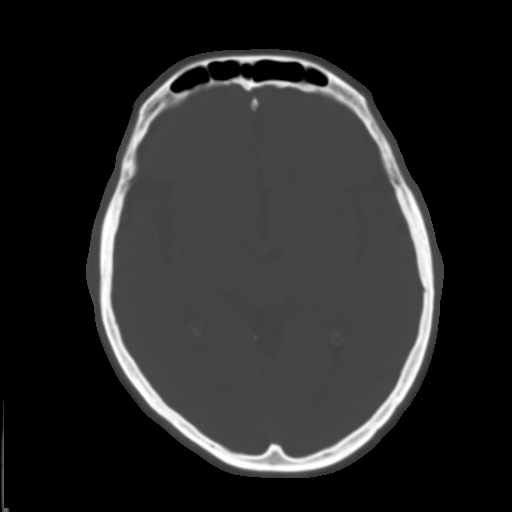
[im 19/35  brain]
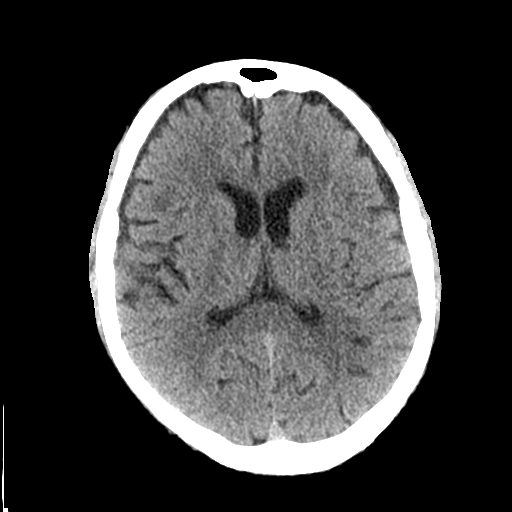
[im 23/35  brain]
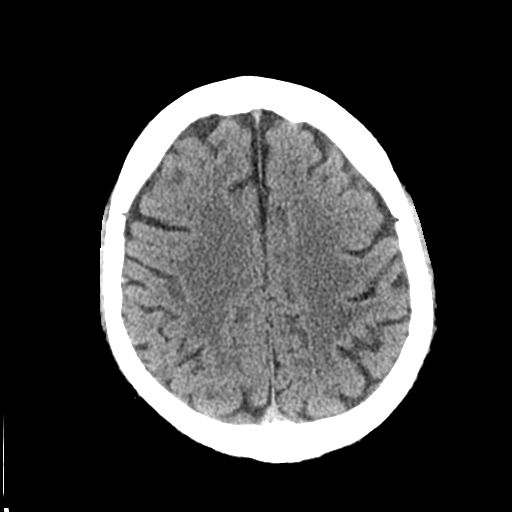
[im 26/35  brain]
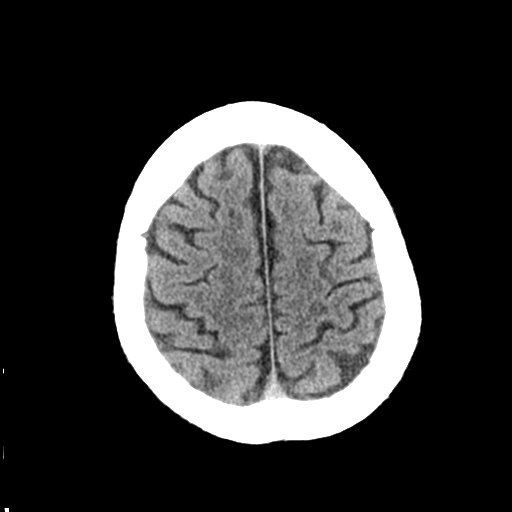
[im 29/35  brain]
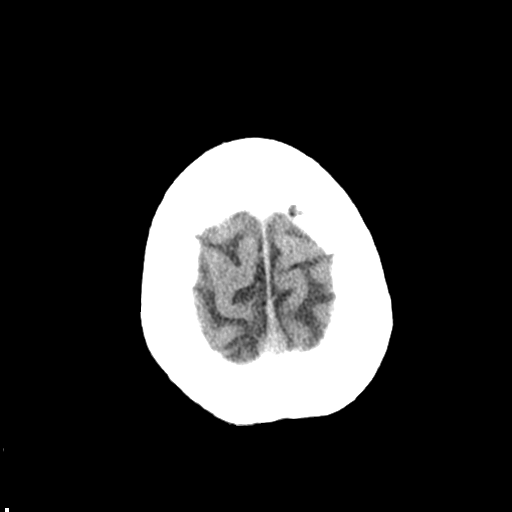
[im 29/35  bone]
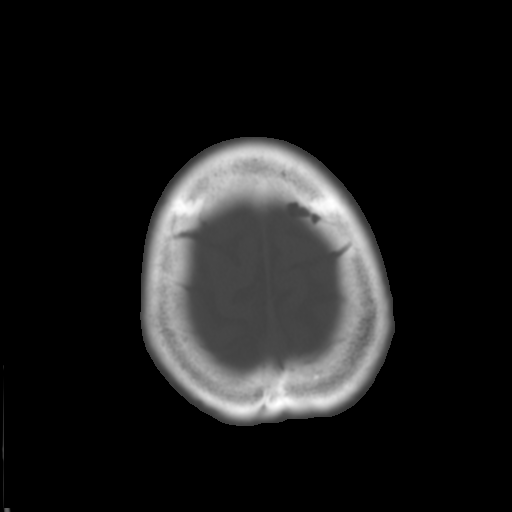
[im 32/35  brain]
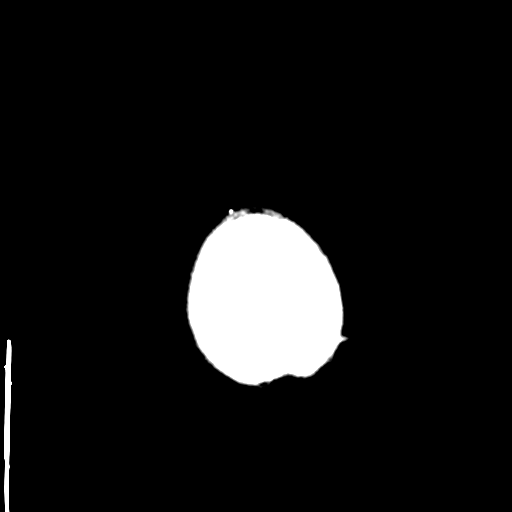

[Series 4: coronal soft tissue · coronal · 0.35mm/px · 3 of 75 slices shown]
[im 25/75  brain]
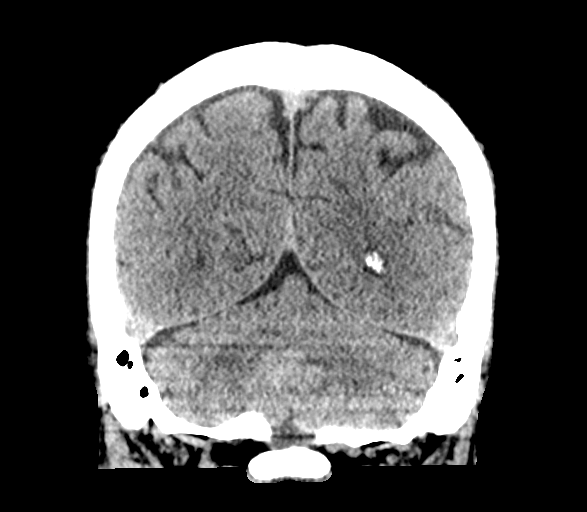
[im 33/75  brain]
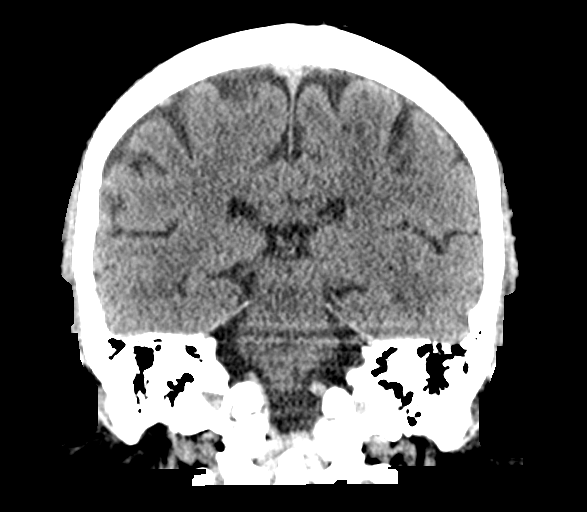
[im 42/75  brain]
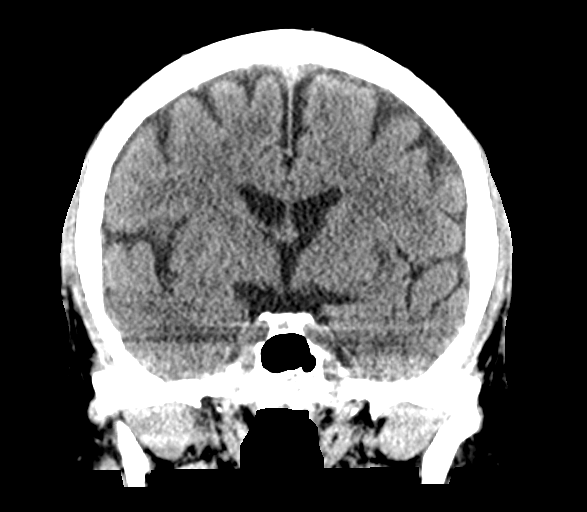

[Series 5: sagittal soft tissue · sagittal · 0.36mm/px · 3 of 65 slices shown]
[im 22/65  brain]
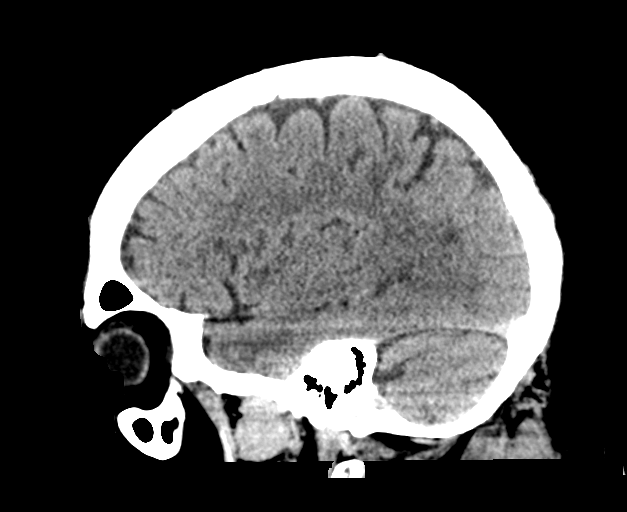
[im 33/65  brain]
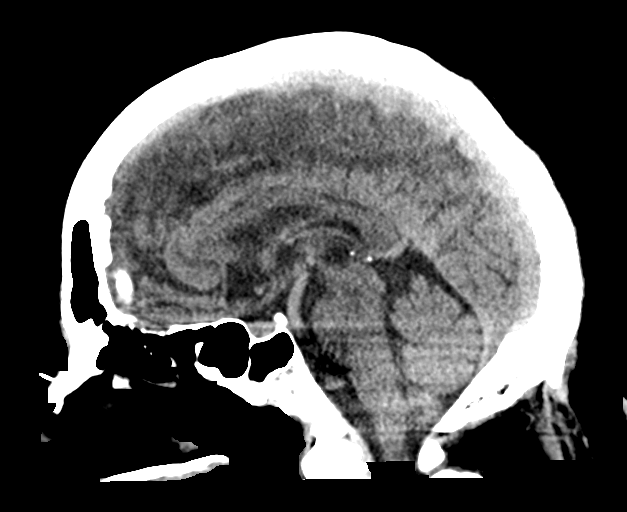
[im 43/65  brain]
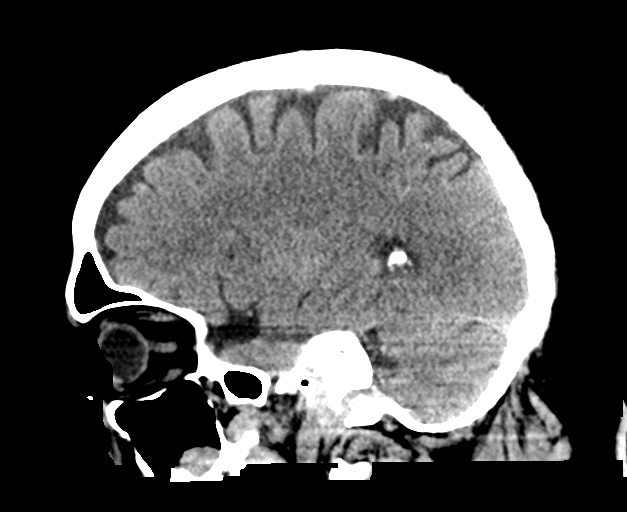

[16 of 47 positions shown; findings below may reference images not displayed]

FINDINGS: Brain: There is mild cerebral atrophy with widening of the
extra-axial spaces and ventricular dilatation.
There are areas of decreased attenuation within the white matter
tracts of the supratentorial brain, consistent with microvascular
disease changes.

Vascular: No hyperdense vessel or unexpected calcification.

Skull: Normal. Negative for fracture or focal lesion.

Sinuses/Orbits: A 1.7 cm x 1.1 cm right maxillary sinus polyp versus
mucous retention cyst is seen.

Other: None.
IMPRESSION: 1. Generalized cerebral atrophy.
2. No acute intracranial abnormality.
3. Right maxillary sinus polyp versus mucous retention cyst.

## 2022-06-04 DIAGNOSIS — D638 Anemia in other chronic diseases classified elsewhere: Secondary | ICD-10-CM | POA: Diagnosis present

## 2022-09-15 ENCOUNTER — Other Ambulatory Visit: Payer: Self-pay

## 2022-09-15 ENCOUNTER — Emergency Department: Payer: Medicare HMO

## 2022-09-15 ENCOUNTER — Inpatient Hospital Stay
Admission: EM | Admit: 2022-09-15 | Discharge: 2022-09-19 | DRG: 163 | Disposition: A | Discharge status: Home / Self Care | Payer: Medicare HMO | Attending: Student in an Organized Health Care Education/Training Program | Admitting: Student in an Organized Health Care Education/Training Program

## 2022-09-15 DIAGNOSIS — I82409 Acute embolism and thrombosis of unspecified deep veins of unspecified lower extremity: Secondary | ICD-10-CM | POA: Diagnosis not present

## 2022-09-15 DIAGNOSIS — Z7984 Long term (current) use of oral hypoglycemic drugs: Secondary | ICD-10-CM

## 2022-09-15 DIAGNOSIS — I10 Essential (primary) hypertension: Secondary | ICD-10-CM | POA: Diagnosis present

## 2022-09-15 DIAGNOSIS — R0902 Hypoxemia: Principal | ICD-10-CM

## 2022-09-15 DIAGNOSIS — J9601 Acute respiratory failure with hypoxia: Secondary | ICD-10-CM | POA: Diagnosis present

## 2022-09-15 DIAGNOSIS — Z7982 Long term (current) use of aspirin: Secondary | ICD-10-CM

## 2022-09-15 DIAGNOSIS — Z87891 Personal history of nicotine dependence: Secondary | ICD-10-CM

## 2022-09-15 DIAGNOSIS — E1169 Type 2 diabetes mellitus with other specified complication: Secondary | ICD-10-CM | POA: Diagnosis present

## 2022-09-15 DIAGNOSIS — D638 Anemia in other chronic diseases classified elsewhere: Secondary | ICD-10-CM | POA: Diagnosis present

## 2022-09-15 DIAGNOSIS — I82452 Acute embolism and thrombosis of left peroneal vein: Secondary | ICD-10-CM | POA: Diagnosis present

## 2022-09-15 DIAGNOSIS — Z1152 Encounter for screening for COVID-19: Secondary | ICD-10-CM | POA: Diagnosis not present

## 2022-09-15 DIAGNOSIS — I2609 Other pulmonary embolism with acute cor pulmonale: Secondary | ICD-10-CM | POA: Diagnosis not present

## 2022-09-15 DIAGNOSIS — I2699 Other pulmonary embolism without acute cor pulmonale: Secondary | ICD-10-CM | POA: Diagnosis not present

## 2022-09-15 DIAGNOSIS — E782 Mixed hyperlipidemia: Secondary | ICD-10-CM | POA: Diagnosis present

## 2022-09-15 DIAGNOSIS — Z79899 Other long term (current) drug therapy: Secondary | ICD-10-CM | POA: Diagnosis not present

## 2022-09-15 DIAGNOSIS — Z7901 Long term (current) use of anticoagulants: Secondary | ICD-10-CM

## 2022-09-15 LAB — CBC WITH DIFFERENTIAL/PLATELET
Abs Immature Granulocytes: 0.07 10*3/uL (ref 0.00–0.07)
Basophils Absolute: 0.1 10*3/uL (ref 0.0–0.1)
Basophils Relative: 1 %
Eosinophils Absolute: 0.1 10*3/uL (ref 0.0–0.5)
Eosinophils Relative: 1 %
HCT: 43.3 % (ref 39.0–52.0)
Hemoglobin: 14 g/dL (ref 13.0–17.0)
Immature Granulocytes: 1 %
Lymphocytes Relative: 11 %
Lymphs Abs: 1.1 10*3/uL (ref 0.7–4.0)
MCH: 29 pg (ref 26.0–34.0)
MCHC: 32.3 g/dL (ref 30.0–36.0)
MCV: 89.6 fL (ref 80.0–100.0)
Monocytes Absolute: 0.7 10*3/uL (ref 0.1–1.0)
Monocytes Relative: 7 %
Neutro Abs: 8.6 10*3/uL — ABNORMAL HIGH (ref 1.7–7.7)
Neutrophils Relative %: 79 %
Platelets: 308 10*3/uL (ref 150–400)
RBC: 4.83 MIL/uL (ref 4.22–5.81)
RDW: 13.5 % (ref 11.5–15.5)
WBC: 10.7 10*3/uL — ABNORMAL HIGH (ref 4.0–10.5)
nRBC: 0 % (ref 0.0–0.2)

## 2022-09-15 LAB — RESP PANEL BY RT-PCR (RSV, FLU A&B, COVID)  RVPGX2
Influenza A by PCR: NEGATIVE
Influenza B by PCR: NEGATIVE
Resp Syncytial Virus by PCR: NEGATIVE
SARS Coronavirus 2 by RT PCR: NEGATIVE

## 2022-09-15 LAB — BASIC METABOLIC PANEL
Anion gap: 11 (ref 5–15)
BUN: 19 mg/dL (ref 8–23)
CO2: 26 mmol/L (ref 22–32)
Calcium: 8.9 mg/dL (ref 8.9–10.3)
Chloride: 98 mmol/L (ref 98–111)
Creatinine, Ser: 1.08 mg/dL (ref 0.61–1.24)
GFR, Estimated: >60 mL/min (ref >60)
Glucose, Bld: 247 mg/dL — ABNORMAL HIGH (ref 70–99)
Potassium: 3.8 mmol/L (ref 3.5–5.1)
Sodium: 135 mmol/L (ref 135–145)

## 2022-09-15 LAB — APTT: aPTT: 168 seconds (ref 24–36)

## 2022-09-15 LAB — TROPONIN I (HIGH SENSITIVITY): Troponin I (High Sensitivity): 27 ng/L — ABNORMAL HIGH (ref <18)

## 2022-09-15 LAB — CBG MONITORING, ED: Glucose-Capillary: 172 mg/dL — ABNORMAL HIGH (ref 70–99)

## 2022-09-15 MED ORDER — INSULIN ASPART 100 UNIT/ML IJ SOLN: 0.0000 [IU] | Freq: Every day | INTRAMUSCULAR | Status: DC

## 2022-09-15 MED ORDER — HYDROCHLOROTHIAZIDE 12.5 MG PO TABS
12.5000 mg | ORAL_TABLET | Freq: Every day | ORAL | Status: DC
  Administered 2022-09-16 – 2022-09-19 (×4): 12.5 mg via ORAL
  Filled 2022-09-15 (×4): qty 1

## 2022-09-15 MED ORDER — SODIUM CHLORIDE 0.9 % IV SOLN: INTRAVENOUS | Status: DC

## 2022-09-15 MED ORDER — ONDANSETRON HCL 4 MG/2ML IJ SOLN: 4.0000 mg | Freq: Four times a day (QID) | INTRAMUSCULAR | Status: DC | PRN

## 2022-09-15 MED ORDER — MORPHINE SULFATE (PF) 2 MG/ML IV SOLN: 2.0000 mg | INTRAVENOUS | Status: DC | PRN

## 2022-09-15 MED ORDER — HEPARIN BOLUS VIA INFUSION
6700.0000 [IU] | Freq: Once | INTRAVENOUS | Status: AC
  Administered 2022-09-15: 6700 [IU] via INTRAVENOUS
  Filled 2022-09-15: qty 6700

## 2022-09-15 MED ORDER — INSULIN ASPART 100 UNIT/ML IJ SOLN
0.0000 [IU] | Freq: Three times a day (TID) | INTRAMUSCULAR | Status: DC
  Administered 2022-09-16 (×3): 5 [IU] via SUBCUTANEOUS
  Administered 2022-09-18 (×2): 2 [IU] via SUBCUTANEOUS
  Administered 2022-09-19: 5 [IU] via SUBCUTANEOUS
  Administered 2022-09-19: 2 [IU] via SUBCUTANEOUS
  Filled 2022-09-15 (×7): qty 1

## 2022-09-15 MED ORDER — LISINOPRIL 10 MG PO TABS
10.0000 mg | ORAL_TABLET | Freq: Every day | ORAL | Status: DC
  Administered 2022-09-16 – 2022-09-19 (×4): 10 mg via ORAL
  Filled 2022-09-15 (×4): qty 1

## 2022-09-15 MED ORDER — ATORVASTATIN CALCIUM 20 MG PO TABS
20.0000 mg | ORAL_TABLET | ORAL | Status: DC
  Administered 2022-09-16 – 2022-09-18 (×2): 20 mg via ORAL
  Filled 2022-09-15 (×2): qty 1

## 2022-09-15 MED ORDER — IOHEXOL 350 MG/ML SOLN
75.0000 mL | Freq: Once | INTRAVENOUS | Status: AC | PRN
  Administered 2022-09-15: 75 mL via INTRAVENOUS

## 2022-09-15 MED ORDER — LISINOPRIL-HYDROCHLOROTHIAZIDE 20-25 MG PO TABS: 1.0000 | ORAL_TABLET | Freq: Every day | ORAL | Status: DC

## 2022-09-15 MED ORDER — ONDANSETRON HCL 4 MG PO TABS: 4.0000 mg | ORAL_TABLET | Freq: Four times a day (QID) | ORAL | Status: DC | PRN

## 2022-09-15 MED ORDER — HEPARIN (PORCINE) 25000 UT/250ML-% IV SOLN
1650.0000 [IU]/h | INTRAVENOUS | Status: DC
  Administered 2022-09-15 – 2022-09-18 (×5): 1650 [IU]/h via INTRAVENOUS
  Filled 2022-09-15 (×5): qty 250

## 2022-09-15 NOTE — ED Notes (Signed)
EKG handed to Archie Balboa, MD

## 2022-09-15 NOTE — ED Triage Notes (Signed)
Pt to ED from home for SOB x 7 days. Pt was found to be hypoxic on RA at 86%. Placed on [place] at 2 liters. Pt advised he has had a cough for about a week as well. Pt is CAOx4 and in no acute distress in triage. Pt in wheelchair. Family with pt.

## 2022-09-15 NOTE — ED Provider Notes (Signed)
[facility] Provider Note    Event Date/Time   First MD Initiated Contact with Patient 09/15/22 1910     (approximate)   History   Shortness of Breath (X 7 days)   HPI  Derek [name] is a 77 y.o. male  who presents to the emergency department today because of concern for shortness of breath. Patient states that he had a cold a couple of weeks ago which was accompanied by cough and fevers. Cough and fevers have improved however for the past week the patient feels like he has been increasingly short of breath. The patient has noticed it with exertion. No associated chest pain. Denies any history of lung disease. Denies any swelling in his extremities.     Physical Exam   Triage Vital Signs: ED Triage Vitals [09/15/22 1904]  Enc Vitals Group     BP (!) 150/86     Pulse Rate 100     Resp 20     Temp 97.9 F (36.6 C)     Temp Source Oral     SpO2 98 %     Weight 245 lb (111.1 kg)     Height 5\' 9"  (1.753 m)     Head Circumference      Peak Flow      Pain Score 0     Pain Loc      Pain Edu?      Excl. in [place]?     Most recent vital signs: Vitals:   09/15/22 1904 09/15/22 1930  BP: (!) 150/86 131/73  Pulse: 100 (!) 101  Resp: 20 20  Temp: 97.9 F (36.6 C)   SpO2: 98% 96%   General: Awake, alert, oriented. CV:  Good peripheral perfusion. Tachycardia, regular rhythm. Resp:  Normal effort. Lungs clear. Abd:  No distention.  Other:  No lower extremity edema.    ED Results / Procedures / Treatments   Labs (all labs ordered are listed, but only abnormal results are displayed) Labs Reviewed  CBC WITH DIFFERENTIAL/PLATELET - Abnormal; Notable for the following components:      Result Value   WBC 10.7 (*)    Neutro Abs 8.6 (*)    All other components within normal limits  BASIC METABOLIC PANEL - Abnormal; Notable for the following components:   Glucose, Bld 247 (*)    All other components within normal limits  TROPONIN I (HIGH SENSITIVITY) -  Abnormal; Notable for the following components:   Troponin I (High Sensitivity) 27 (*)    All other components within normal limits  RESP PANEL BY RT-PCR (RSV, FLU A&B, COVID)  [id number]  HEPARIN LEVEL (UNFRACTIONATED)  CBC  APTT     EKG  I, Nance Pear, attending physician, personally viewed and interpreted this EKG  EKG Time: 1930 Rate: 100 Rhythm: sinus tachycardia Axis: normal Intervals: qtc 445 QRS: narrow ST changes: no st elevation Impression: abnormal ekg   RADIOLOGY I independently interpreted and visualized the CXR. My interpretation: No pneumonia Radiology interpretation:  IMPRESSION:  No acute abnormalities.      PROCEDURES:  Critical Care performed: Yes  CRITICAL CARE Performed by: Nance Pear   Total critical care time: 30 minutes  Critical care time was exclusive of separately billable procedures and treating other patients.  Critical care was necessary to treat or prevent imminent or life-threatening deterioration.  Critical care was time spent personally by me on the following activities: development of treatment plan with patient and/or surrogate as well as  nursing, discussions with consultants, evaluation of patient's response to treatment, examination of patient, obtaining history from patient or surrogate, ordering and performing treatments and interventions, ordering and review of laboratory studies, ordering and review of radiographic studies, pulse oximetry and re-evaluation of patient's condition.   Procedures    MEDICATIONS ORDERED IN ED: Medications - No data to display   IMPRESSION / MDM / [place] / ED COURSE  I reviewed the triage vital signs and the nursing notes.                              Differential diagnosis includes, but is not limited to, pneumonia, pneumothorax, pulmonary edema, PE.  Patient's presentation is most consistent with acute presentation with potential threat to life or bodily  function.   The patient is on the cardiac monitor to evaluate for evidence of arrhythmia and/or significant heart rate changes.  Patient presented to the emergency department today because of concerns for shortness of breath.  On arrival patient was found to be hypoxic on room air. He was placed on nasal cannula. Patient Is also slightly tachycardic.  Chest x-ray without any pneumonia.  Blood work without significant abnormality save for slight troponin elevation.  Did obtain CT angio which is concerning for bilateral PEs.  At this time I do think this explains this patient's symptoms.  Will start IV heparin. Discussed with Dr. Jonelle Sidle with the hospitalist service who will plan on admission.      FINAL CLINICAL IMPRESSION(S) / ED DIAGNOSES   Final diagnoses:  Hypoxia  Pulmonary embolism, other, unspecified chronicity, unspecified whether acute cor pulmonale present [facility])     Note:  This document was prepared using Dragon voice recognition software and may include unintentional dictation errors.    Nance Pear, MD 09/15/22 2159

## 2022-09-15 NOTE — H&P (Signed)
History and Physical    Patient: Derek [name] [id number] DOB: 04-24-1946 DOA: 09/15/2022 DOS: the patient was seen and examined on 09/15/2022 PCP: Dion Body, MD  Patient coming from: Home  Chief Complaint:  Chief Complaint  Patient presents with   Shortness of Breath    X 7 days   HPI: Derek [name] is a 77 y.o. male with medical history significant of diabetes, hypertension, hyperlipidemia, who lives at home and had recent upper respite tract infection.  She suspected she had some viral infection but not sure what.  It was not treated or evaluated for viruses including COVID-19 at the time.  Patient has been having shortness of breath for the last 7 days.  Today she was found to be hypoxic on room air with oxygen sats of 86%.  She was placed on 2 L and brought here by EMS.  She has had some cough within the last 7 days.  Here with her family who are giving history.  Workup so far showed normal chest x-ray but CT angiogram of the chest showed significant pulmonary embolism.  Patient is being admitted for further evaluation and treatment.  Review of Systems: As mentioned in the history of present illness. All other systems reviewed and are negative. Past Medical History:  Diagnosis Date   Diabetes mellitus without complication ([place])    Hypertension    Mixed hyperlipidemia    Past Surgical History:  Procedure Laterality Date   BACK SURGERY  02/04/2013   L1-L2   CATARACT EXTRACTION W/PHACO Right 01/14/2022   Procedure: CATARACT EXTRACTION PHACO AND INTRAOCULAR LENS PLACEMENT ([place]) RIGHT DIABETIC;  Surgeon: Norvel Richards, MD;  Location: [facility];  Service: Ophthalmology;  Laterality: Right;  27.29 2:44.3   CATARACT EXTRACTION W/PHACO Left 01/24/2022   Procedure: CATARACT EXTRACTION PHACO AND INTRAOCULAR LENS PLACEMENT ([facility]) LEFT DIABETIC 19.04 01:44.7;  Surgeon: Norvel Richards, MD;  Location: [facility];  Service: Ophthalmology;  Laterality:  Left;   Social History:  reports that he has quit smoking. His smoking use included cigarettes. He has never used smokeless tobacco. He reports that he does not currently use alcohol. He reports that he does not use drugs.  No Known Allergies  History reviewed. No pertinent family history.  Prior to Admission medications   Medication Sig Start Date End Date Taking? Authorizing Provider  ascorbic acid (VITAMIN C) 500 MG tablet Take 500 mg by mouth daily.    [provider]  aspirin EC 81 MG tablet Take 81 mg by mouth daily. Swallow whole.    [provider]  atorvastatin (LIPITOR) 40 MG tablet Take 40 mg by mouth daily.    [provider]  Cholecalciferol (VITAMIN D3 PO) Take by mouth daily.    [provider]  Garlic 123XX123 MG TABS Take by mouth.    [provider]  glipiZIDE (GLUCOTROL) 5 MG tablet Take 5 mg by mouth daily before breakfast. Patient not taking: Reported on 01/14/2022    [provider]  lisinopril-hydrochlorothiazide (ZESTORETIC) 20-25 MG tablet Take 1 tablet by mouth daily. 10-12.5    [provider]  meclizine (ANTIVERT) 25 MG tablet Take 1 tablet (25 mg total) by mouth 3 (three) times daily as needed for dizziness or nausea. Patient not taking: Reported on 01/17/2022 04/04/21   Paulette Blanch, MD  Misc Natural Products (BLOOD SUGAR 360) CAPS Take by mouth daily.    [provider]  Multiple Vitamin (MULTIVITAMIN) capsule Take 1 capsule by mouth  daily.    [provider]  Omega-3 Fatty Acids (FISH OIL PO) Take by mouth daily.    [provider]  ondansetron (ZOFRAN ODT) 4 MG disintegrating tablet Take 1 tablet (4 mg total) by mouth every 8 (eight) hours as needed for nausea or vomiting. Patient not taking: Reported on 01/17/2022 04/04/21   Paulette Blanch, MD  pyridOXINE (VITAMIN B-6) 100 MG tablet Take 100 mg by mouth daily.    [provider]    Physical Exam: Vitals:   09/15/22  1904 09/15/22 1930 09/15/22 2030  BP: (!) 150/86 131/73 128/74  Pulse: 100 (!) 101 97  Resp: 20 20 20   Temp: 97.9 F (36.6 C)    TempSrc: Oral    SpO2: 98% 96% 95%  Weight: 111.1 kg    Height: 5\' 9"  (1.753 m)     Constitutional: Acutely ill looking, NAD, calm, comfortable Eyes: PERRL, lids and conjunctivae normal ENMT: Mucous membranes are moist. Posterior pharynx clear of any exudate or lesions.Normal dentition.  Neck: normal, supple, no masses, no thyromegaly Respiratory: Decreased air entry bilaterally with coarse breath sounds,, no wheezing, no crackles. Normal respiratory effort. No accessory muscle use.  Cardiovascular: Sinus tachycardia, no murmurs / rubs / gallops. No extremity edema. 2+ pedal pulses. No carotid bruits.  Abdomen: no tenderness, no masses palpated. No hepatosplenomegaly. Bowel sounds positive.  Musculoskeletal: Good range of motion, no joint swelling or tenderness, Skin: no rashes, lesions, ulcers. No induration Neurologic: CN 2-12 grossly intact. Sensation intact, DTR normal. Strength 5/5 in all 4.  Psychiatric: Normal judgment and insight. Alert and oriented x 3. Normal mood  Data Reviewed:  Patient's temperature is normal but blood pressure 150/86, her pulse 101.  White count is 10.7.  The rest of the CBC and chemistry appear to be within normal.  Acute viral screen is negative for COVID and flu.  Chest x-ray showed no acute findings.CT angio of the chest shows positive for multiple bilateral acute pulmonary emboli.  No clear evidence of right heart strain.  Troponin is mildly elevated at 27.    Assessment and Plan:  #1 bilateral pulmonary emboli: No obvious trigger.  No prior PE.  Patient has recent respiratory infection.  Could have been COVID-19 that she recovered from.  Could also be decreased mobility.  At this point we will admit the patient.  Initiate heparin drip.  Vascular surgery being consulted to evaluate for possible thrombectomy.  If not a  candidate we will continue medical therapy.  Transition to oral anticoagulation.  Get echocardiogram to further evaluate with this submassive PE.  #2 type 2 diabetes: Non-insulin-dependent.  Initiate sliding scale insulin.  Hold glipizide.  Accu-Cheks before every meal and nightly.  #3 essential hypertension: Will resume home regimen.  Patient is on lisinopril hydrochlorothiazide.  #4 hyperlipidemia: Resume statin.  #5 acute respiratory failure with hypoxemia: Secondary to bilateral pulmonary emboli above.  Continue with oxygenation.  #6 anemia of chronic disease: Monitor H&H.      Advance Care Planning:   Code Status: Full Code   Consults: None but vascular surgery may be involved  Family Communication: Daughter  Severity of Illness: The appropriate patient status for this patient is INPATIENT. Inpatient status is judged to be reasonable and necessary in order to provide the required intensity of service to ensure the patient's safety. The patient's presenting symptoms, physical exam findings, and initial radiographic and laboratory data in the context of their chronic comorbidities is felt to place them at high  risk for further clinical deterioration. Furthermore, it is not anticipated that the patient will be medically stable for discharge from the hospital within 2 midnights of admission.   * I certify that at the point of admission it is my clinical judgment that the patient will require inpatient hospital care spanning beyond 2 midnights from the point of admission due to high intensity of service, high risk for further deterioration and high frequency of surveillance required.*  AuthorBarbette Merino, MD 09/15/2022 10:01 PM  For on call review [email or web address].

## 2022-09-15 NOTE — Consult Note (Signed)
ANTICOAGULATION CONSULT NOTE - Initial Consult  Pharmacy Consult for heparin infusion Indication: pulmonary embolus  No Known Allergies  Patient Measurements: Height: 5\' 9"  (175.3 cm) Weight: 111.1 kg (245 lb) IBW/kg (Calculated) : 70.7 Heparin Dosing Weight: 95.2 kg  Vital Signs: Temp: 97.9 F (36.6 C) (03/24 1904) Temp Source: Oral (03/24 1904) BP: 128/74 (03/24 2030) Pulse Rate: 97 (03/24 2030)  Labs: Recent Labs    09/15/22 1930  HGB 14.0  HCT 43.3  PLT 308  CREATININE 1.08    Estimated Creatinine Clearance: 70.4 mL/min (by C-G formula based on SCr of 1.08 mg/dL).   Medical History: Past Medical History:  Diagnosis Date   Diabetes mellitus without complication ([place])    Hypertension    Mixed hyperlipidemia     Medications:  PTA: N/A Inpatient: heparin infusion 3/24 >>> Allergies: NKDA  Assessment: 77yo male presents to ED with hypoxia and persistent cough. CTA positive for multiple bilateral acute pulmonary emboli with no evidence of right heart strain. Pharmacy consulted for management of heparin infusion in the setting of pulmonary embolism  Date Time aPTT/HL Rate/Comment  Goal of Therapy:  Heparin level 0.3-0.7 units/ml Monitor platelets by anticoagulation protocol: Yes   Plan:  Give 6700 units bolus x1; then start heparin infusion at 1650 units/hr Check anti-Xa level in 8 hours and daily once consecutively therapeutic. Continue to monitor H&H and platelets daily while on heparin gtt.   [place] Pharmacist 09/15/2022 9:31 PM

## 2022-09-16 ENCOUNTER — Inpatient Hospital Stay
Admit: 2022-09-16 | Discharge: 2022-09-16 | Disposition: A | Discharge status: Home / Self Care | Payer: Medicare HMO | Attending: Internal Medicine | Admitting: Internal Medicine

## 2022-09-16 ENCOUNTER — Inpatient Hospital Stay: Payer: Medicare HMO

## 2022-09-16 DIAGNOSIS — Z87891 Personal history of nicotine dependence: Secondary | ICD-10-CM

## 2022-09-16 DIAGNOSIS — I2699 Other pulmonary embolism without acute cor pulmonale: Secondary | ICD-10-CM

## 2022-09-16 DIAGNOSIS — I82452 Acute embolism and thrombosis of left peroneal vein: Secondary | ICD-10-CM | POA: Diagnosis not present

## 2022-09-16 DIAGNOSIS — J9601 Acute respiratory failure with hypoxia: Secondary | ICD-10-CM | POA: Diagnosis not present

## 2022-09-16 DIAGNOSIS — I2609 Other pulmonary embolism with acute cor pulmonale: Secondary | ICD-10-CM

## 2022-09-16 LAB — ECHOCARDIOGRAM COMPLETE
AR max vel: 2.7 cm2
AV Area VTI: 2.66 cm2
AV Area mean vel: 2.57 cm2
AV Mean grad: 3 mmHg
AV Peak grad: 5.7 mmHg
Ao pk vel: 1.19 m/s
Area-P 1/2: 3.23 cm2
Height: 69 in
MV VTI: 2.22 cm2
S' Lateral: 3.6 cm
Weight: 3920 oz

## 2022-09-16 LAB — COMPREHENSIVE METABOLIC PANEL
ALT: 15 U/L (ref 0–44)
AST: 20 U/L (ref 15–41)
Albumin: 3.5 g/dL (ref 3.5–5.0)
Alkaline Phosphatase: 56 U/L (ref 38–126)
Anion gap: 16 — ABNORMAL HIGH (ref 5–15)
BUN: 15 mg/dL (ref 8–23)
CO2: 25 mmol/L (ref 22–32)
Calcium: 9 mg/dL (ref 8.9–10.3)
Chloride: 101 mmol/L (ref 98–111)
Creatinine, Ser: 0.92 mg/dL (ref 0.61–1.24)
GFR, Estimated: >60 mL/min (ref >60)
Glucose, Bld: 169 mg/dL — ABNORMAL HIGH (ref 70–99)
Potassium: 3.6 mmol/L (ref 3.5–5.1)
Sodium: 139 mmol/L (ref 135–145)
Total Bilirubin: 0.9 mg/dL (ref 0.3–1.2)
Total Protein: 6.6 g/dL (ref 6.5–8.1)

## 2022-09-16 LAB — HEPARIN LEVEL (UNFRACTIONATED)
Heparin Unfractionated: 0.43 IU/mL (ref 0.30–0.70)
Heparin Unfractionated: 0.42 IU/mL (ref 0.30–0.70)

## 2022-09-16 LAB — CBC
HCT: 38.2 % — ABNORMAL LOW (ref 39.0–52.0)
Hemoglobin: 12.7 g/dL — ABNORMAL LOW (ref 13.0–17.0)
MCH: 29.4 pg (ref 26.0–34.0)
MCHC: 33.2 g/dL (ref 30.0–36.0)
MCV: 88.4 fL (ref 80.0–100.0)
Platelets: 263 10*3/uL (ref 150–400)
RBC: 4.32 MIL/uL (ref 4.22–5.81)
RDW: 13.6 % (ref 11.5–15.5)
WBC: 9 10*3/uL (ref 4.0–10.5)
nRBC: 0 % (ref 0.0–0.2)

## 2022-09-16 LAB — CBG MONITORING, ED
Glucose-Capillary: 214 mg/dL — ABNORMAL HIGH (ref 70–99)
Glucose-Capillary: 219 mg/dL — ABNORMAL HIGH (ref 70–99)
Glucose-Capillary: 214 mg/dL — ABNORMAL HIGH (ref 70–99)

## 2022-09-16 LAB — GLUCOSE, CAPILLARY: Glucose-Capillary: 172 mg/dL — ABNORMAL HIGH (ref 70–99)

## 2022-09-16 MED ORDER — MIDAZOLAM HCL 2 MG/ML PO SYRP: 8.0000 mg | ORAL_SOLUTION | Freq: Once | ORAL | Status: DC | PRN

## 2022-09-16 MED ORDER — HYDROMORPHONE HCL 1 MG/ML IJ SOLN: 1.0000 mg | Freq: Once | INTRAMUSCULAR | Status: DC | PRN

## 2022-09-16 MED ORDER — FENTANYL CITRATE PF 50 MCG/ML IJ SOSY: 12.5000 ug | PREFILLED_SYRINGE | Freq: Once | INTRAMUSCULAR | Status: DC | PRN

## 2022-09-16 MED ORDER — FAMOTIDINE 20 MG PO TABS: 40.0000 mg | ORAL_TABLET | Freq: Once | ORAL | Status: DC | PRN

## 2022-09-16 MED ORDER — CEFAZOLIN SODIUM-DEXTROSE 2-4 GM/100ML-% IV SOLN
2.0000 g | INTRAVENOUS | Status: DC
  Filled 2022-09-16: qty 100

## 2022-09-16 MED ORDER — CHLORHEXIDINE GLUCONATE 4 % EX LIQD
60.0000 mL | Freq: Once | CUTANEOUS | Status: AC
  Administered 2022-09-17: 4 via TOPICAL

## 2022-09-16 MED ORDER — SODIUM CHLORIDE 0.9 % IV SOLN: INTRAVENOUS | Status: DC

## 2022-09-16 MED ORDER — DIPHENHYDRAMINE HCL 50 MG/ML IJ SOLN: 50.0000 mg | Freq: Once | INTRAMUSCULAR | Status: DC | PRN

## 2022-09-16 MED ORDER — ACETAMINOPHEN 325 MG PO TABS
650.0000 mg | ORAL_TABLET | Freq: Four times a day (QID) | ORAL | Status: DC | PRN
  Administered 2022-09-16 (×2): 650 mg via ORAL
  Filled 2022-09-16 (×2): qty 2

## 2022-09-16 MED ORDER — METHYLPREDNISOLONE SODIUM SUCC 125 MG IJ SOLR: 125.0000 mg | Freq: Once | INTRAMUSCULAR | Status: DC | PRN

## 2022-09-16 MED ORDER — CHLORHEXIDINE GLUCONATE 4 % EX LIQD
60.0000 mL | Freq: Once | CUTANEOUS | Status: AC
  Administered 2022-09-16: 4 via TOPICAL

## 2022-09-16 MED ORDER — ONDANSETRON HCL 4 MG/2ML IJ SOLN: 4.0000 mg | Freq: Four times a day (QID) | INTRAMUSCULAR | Status: DC | PRN

## 2022-09-16 NOTE — Consult Note (Signed)
Hospital Consult    Reason for Consult: Pulmonary Embolism  Requesting Physician:  Dr Nicole Kindred MD MRN #:  [id number]  History of Present Illness: This is a 77 y.o. male with a medical history significant for diabetes, hypertension, hyperlipidemia.  Patient endorses being exposed to a friend who is COVID-positive about a month ago.  Right afterwards he developed cold symptoms but was never tested for COVID.  Therefore he denies ever having COVID.  However he endorses with the cold symptoms and ever increasing shortness of breath.  The shortness of breath peaked yesterday which caused him to come to [facility] emergency department for treatment.  Upon evaluation he had a CAT scan of his chest showing bilateral pulmonary embolisms without any heart right heart strain.  He is also noted to have a right lower extremity DVT on ultrasound.  On exam this afternoon patient is resting comfortably in bed in the emergency department.  Heparin infusion is running.  He is not wearing oxygen at this point.  He does endorse increasing shortness of breath upon exertion such as standing at the bedside.  He denies any chest pain, TIA or stroke symptoms today.  Denies any dizziness or productive cough.  Denies any pain in his lower extremities either with exertion or at rest.  Vitals all remained stable.  Patient has no other complaints.  Past Medical History:  Diagnosis Date   Diabetes mellitus without complication ([place])    Hypertension    Mixed hyperlipidemia     Past Surgical History:  Procedure Laterality Date   BACK SURGERY  02/04/2013   L1-L2   CATARACT EXTRACTION W/PHACO Right 01/14/2022   Procedure: CATARACT EXTRACTION PHACO AND INTRAOCULAR LENS PLACEMENT ([place]) RIGHT DIABETIC;  Surgeon: Norvel Richards, MD;  Location: [facility];  Service: Ophthalmology;  Laterality: Right;  27.29 2:44.3   CATARACT EXTRACTION W/PHACO Left 01/24/2022   Procedure: CATARACT EXTRACTION PHACO AND INTRAOCULAR  LENS PLACEMENT ([facility]) LEFT DIABETIC 19.04 01:44.7;  Surgeon: Norvel Richards, MD;  Location: [place] [facility];  Service: Ophthalmology;  Laterality: Left;    No Active Allergies  Prior to Admission medications   Medication Sig Start Date End Date Taking? Authorizing Provider  ascorbic acid (VITAMIN C) 500 MG tablet Take 500 mg by mouth daily.   Yes [provider]  aspirin EC 81 MG tablet Take 81 mg by mouth daily. Swallow whole.   Yes [provider]  atorvastatin (LIPITOR) 40 MG tablet Take 20 mg by mouth every other day.   Yes [provider]  Cholecalciferol (VITAMIN D3 PO) Take by mouth daily.   Yes [provider]  lisinopril-hydrochlorothiazide (ZESTORETIC) 20-25 MG tablet Take 1 tablet by mouth daily. 10-12.5   Yes [provider]  Multiple Vitamin (MULTIVITAMIN) capsule Take 1 capsule by mouth daily.   Yes [provider]  zinc sulfate 220 (50 Zn) MG capsule Take 220 mg by mouth daily.   Yes [provider]    Social History   Socioeconomic History   Marital status: Married    Spouse name: Not on file   Number of children: Not on file   Years of education: Not on file   Highest education level: Not on file  Occupational History   Not on file  Tobacco Use   Smoking status: Former    Types: Cigarettes   Smokeless tobacco: Never   Tobacco comments:    "Quit when they got to 95 cents/pack"  Vaping Use   Vaping  Use: Never used  Substance and Sexual Activity   Alcohol use: Not Currently   Drug use: Never   Sexual activity: Not on file  Other Topics Concern   Not on file  Social History Narrative   Not on file   Social Determinants of Health   Financial Resource Strain: Not on file  Food Insecurity: No Food Insecurity (09/16/2022)   Hunger Vital Sign    Worried About Running Out of Food in the Last Year: Never true    Ran Out of Food in the Last Year: Never true  Transportation Needs: No  Transportation Needs (09/16/2022)   PRAPARE - Hydrologist (Medical): No    Lack of Transportation (Non-Medical): No  Physical Activity: Not on file  Stress: Not on file  Social Connections: Not on file  Intimate Partner Violence: Not At Risk (09/16/2022)   Humiliation, Afraid, Rape, and Kick questionnaire    Fear of Current or Ex-Partner: No    Emotionally Abused: No    Physically Abused: No    Sexually Abused: No     History reviewed. No pertinent family history.  ROS: Otherwise negative unless mentioned in HPI  Physical Examination  Vitals:   09/16/22 0759 09/16/22 1136  BP:  (!) 142/69  Pulse: 73 77  Resp: 15 (!) 21  Temp:  98 F (36.7 C)  SpO2: 97% 96%   Body mass index is 36.18 kg/m.  General:  WDWN in NAD Gait: Not observed HENT: WNL, normocephalic Pulmonary: normal non-labored breathing, without Rales, rhonchi,  wheezing Cardiac: regular, without  Murmurs, rubs or gallops; without carotid bruits Abdomen: Positive bowel sounds throughout soft, NT/ND, no masses Skin: without rashes Vascular Exam/Pulses: Positive palpable radial, posttibial, dorsalis pedis pulses. Extremities: without ischemic changes, without Gangrene , without cellulitis; without open wounds;  Musculoskeletal: no muscle wasting or atrophy  Neurologic: A&O X 3;  No focal weakness or paresthesias are detected; speech is fluent/normal Psychiatric:  The pt has Normal affect. Lymph:  Unremarkable  CBC    Component Value Date/Time   WBC 9.0 09/16/2022 0514   RBC 4.32 09/16/2022 0514   HGB 12.7 (L) 09/16/2022 0514   HCT 38.2 (L) 09/16/2022 0514   PLT 263 09/16/2022 0514   MCV 88.4 09/16/2022 0514   MCH 29.4 09/16/2022 0514   MCHC 33.2 09/16/2022 0514   RDW 13.6 09/16/2022 0514   LYMPHSABS 1.1 09/15/2022 1930   MONOABS 0.7 09/15/2022 1930   EOSABS 0.1 09/15/2022 1930   BASOSABS 0.1 09/15/2022 1930    BMET    Component Value Date/Time   NA 139 09/16/2022  0514   K 3.6 09/16/2022 0514   CL 101 09/16/2022 0514   CO2 25 09/16/2022 0514   GLUCOSE 169 (H) 09/16/2022 0514   BUN 15 09/16/2022 0514   CREATININE 0.92 09/16/2022 0514   CALCIUM 9.0 09/16/2022 0514   GFRNONAA >60 09/16/2022 0514   GFRAA >60 01/30/2019 1555    COAGS: No results found for: "INR", "PROTIME"   Non-Invasive Vascular Imaging:   EXAM: Bilateral LOWER EXTREMITY VENOUS DOPPLER ULTRASOUND  IMPRESSION: No evidence of right lower extremity DVT.   Left below-the-knee peroneal vein thrombus. Please correlate with history of known pulmonary embolism  EXAM: CT ANGIOGRAPHY CHEST WITH CONTRAST  IMPRESSION: Positive examination for multiple bilateral acute pulmonary emboli. No evidence of right heart strain.  Statin:  Yes.   Beta Blocker:  No. Aspirin:  No. ACEI:  Yes.   ARB:  No. CCB  use:  No Other antiplatelets/anticoagulants:  No.    ASSESSMENT/PLAN: This is a 77 y.o. male presents to [facility] emergency department on 09/15/2022 for increased shortness of breath.  On workup patient was found to have bilateral pulmonary embolisms and right lower extremity DVT.  Plan:  Patient will be taken to the vascular lab for mechanical pulmonary thrombectomy with possible inferior vena cava filter placement on 09/17/2022.  I discussed in detail with the patient and his daughter at the bedside the procedure, benefits, risks, and complications.  Both verbalized their understanding.  I answered all their questions.  Both would like to proceed with the procedure as soon as possible.  Patient continues on heparin infusion.  Patient will be made n.p.o. after midnight.   -I discussed the plan in detail with Dr. [name] MD and he is in agreement with the plan.   Drema Pry [facility] 09/16/2022 1:32 PM

## 2022-09-16 NOTE — Assessment & Plan Note (Signed)
Continue IV heparin Vascular Surgery consulted - likely for thrombectomy tomorrow DOAC at discharge Wean O2 as tolerated, maintain O2 sat > 90% Telemetry

## 2022-09-16 NOTE — Assessment & Plan Note (Signed)
Continue home meds, lisinopril and HCTZ

## 2022-09-16 NOTE — ED Notes (Signed)
Pt back to bed without assistance and reconnected to [facility] monitoring. No further needs expressed at this time, WCTM.

## 2022-09-16 NOTE — Assessment & Plan Note (Signed)
Continue statin. 

## 2022-09-16 NOTE — Consult Note (Signed)
ANTICOAGULATION CONSULT NOTE   Pharmacy Consult for heparin infusion Indication: pulmonary embolus  No Active Allergies  Patient Measurements: Height: 5\' 9"  (175.3 cm) Weight: 111.1 kg (245 lb) IBW/kg (Calculated) : 70.7 Heparin Dosing Weight: 95.2 kg  Vital Signs: Temp: 98 F (36.7 C) (03/25 1136) Temp Source: Oral (03/25 0640) BP: 133/67 (03/25 1452) Pulse Rate: 85 (03/25 1452)  Labs: Recent Labs    09/15/22 1930 09/15/22 2223 09/16/22 0514 09/16/22 1451  HGB 14.0  --  12.7*  --   HCT 43.3  --  38.2*  --   PLT 308  --  263  --   APTT  --  168*  --   --   HEPARINUNFRC  --   --  0.43 0.42  CREATININE 1.08  --  0.92  --   TROPONINIHS 27*  --   --   --      Estimated Creatinine Clearance: 82.6 mL/min (by C-G formula based on SCr of 0.92 mg/dL).   Medical History: Past Medical History:  Diagnosis Date   Diabetes mellitus without complication ([place])    Hypertension    Mixed hyperlipidemia     Medications:  PTA: N/A Inpatient: heparin infusion 3/24 >>> Allergies: NKDA  Assessment: 77yo male presents to ED with hypoxia and persistent cough. CTA positive for multiple bilateral acute pulmonary emboli with no evidence of right heart strain. Pharmacy consulted for management of heparin infusion in the setting of pulmonary embolism  Date Time aPTT/HL Rate/Comment 3/25 0514 HL 0.43   Therapeutic x 1 3/25 1451 HL 0.42    Therapeutic x 2  Goal of Therapy:  Heparin level 0.3-0.7 units/ml Monitor platelets by anticoagulation protocol: Yes   Plan:  Continue heparin infusion at 1650 units/hr Check HL with AM labs Continue to monitor H&H and platelets daily while on heparin gtt.  Tollie Eth III, PharmD 09/16/2022 3:44 PM

## 2022-09-16 NOTE — Assessment & Plan Note (Signed)
Due to acute Pe's. Wean O2 as tolerated Mgmt of Pe's as outlined

## 2022-09-16 NOTE — Consult Note (Signed)
ANTICOAGULATION CONSULT NOTE   Pharmacy Consult for heparin infusion Indication: pulmonary embolus  No Active Allergies  Patient Measurements: Height: 5\' 9"  (175.3 cm) Weight: 111.1 kg (245 lb) IBW/kg (Calculated) : 70.7 Heparin Dosing Weight: 95.2 kg  Vital Signs: Temp: 98.1 F (36.7 C) (03/24 2328) Temp Source: Oral (03/24 2328) BP: 129/67 (03/25 0308) Pulse Rate: 74 (03/25 0308)  Labs: Recent Labs    09/15/22 1930 09/15/22 2223 09/16/22 0514  HGB 14.0  --  12.7*  HCT 43.3  --  38.2*  PLT 308  --  263  APTT  --  168*  --   HEPARINUNFRC  --   --  0.43  CREATININE 1.08  --   --   TROPONINIHS 27*  --   --      Estimated Creatinine Clearance: 70.4 mL/min (by C-G formula based on SCr of 1.08 mg/dL).   Medical History: Past Medical History:  Diagnosis Date   Diabetes mellitus without complication ([place])    Hypertension    Mixed hyperlipidemia     Medications:  PTA: N/A Inpatient: heparin infusion 3/24 >>> Allergies: NKDA  Assessment: 77yo male presents to ED with hypoxia and persistent cough. CTA positive for multiple bilateral acute pulmonary emboli with no evidence of right heart strain. Pharmacy consulted for management of heparin infusion in the setting of pulmonary embolism  Date Time aPTT/HL Rate/Comment 3/25 0514 HL 0.43 Therapeutic x 1  Goal of Therapy:  Heparin level 0.3-0.7 units/ml Monitor platelets by anticoagulation protocol: Yes   Plan:  Continue heparin infusion at 1650 units/hr Recheck HL in 8 hr to confirm. Continue to monitor H&H and platelets daily while on heparin gtt.  Renda Rolls, PharmD, MBA 09/16/2022 6:18 AM

## 2022-09-16 NOTE — Assessment & Plan Note (Signed)
Doppler U/S showed a Left below-the-knee peroneal vein DVT. Continue IV heparin. Vascular consulted - possible IVC filter today DOAC on d/c. See also Acute Pe

## 2022-09-16 NOTE — ED Notes (Signed)
Pt independently ambulatory to bathroom with portable oxygen.

## 2022-09-16 NOTE — ED Notes (Addendum)
Pt ambulatory to and from bathroom with portable oxygen without difficulty. Pt provided with personal hygiene items. Pt reconnected to [facility] monitoring. Call [place] in reach, [place].

## 2022-09-16 NOTE — Progress Notes (Signed)
Progress Note   Patient: Derek [name] [id number] DOB: [birth year] DOA: 09/15/2022     1 DOS: the patient was seen and examined on 09/16/2022   Brief hospital course: HPI on admission on 3/24 PM, by Dr. Jonelle Sidle: "Levar Knierim is a 77 y.o. male with medical history significant of diabetes, hypertension, hyperlipidemia, who lives at home and had recent upper respite tract infection.  She suspected she had some viral infection but not sure what.  It was not treated or evaluated for viruses including COVID-19 at the time.  Patient has been having shortness of breath for the last 7 days.  Today she was found to be hypoxic on room air with oxygen sats of 86%.  She was placed on 2 L and brought here by EMS.  She has had some cough within the last 7 days.  Here with her family who are giving history.  Workup so far showed normal chest x-ray but CT angiogram of the chest showed significant pulmonary embolism.  Patient is being admitted for further evaluation and treatment. "  He was started on IV heparin and vascular surgery was consulted  3/25 -- Doppler U/S positive for LLE peroneal DVT below the knee.  Assessment and Plan: * Acute respiratory failure with hypoxemia (HCC) Due to acute Pe's. Wean O2 as tolerated Mgmt of Pe's as outlined  Acute deep vein thrombosis (DVT) of left peroneal vein (HCC) Doppler U/S showed a Left below-the-knee peroneal vein DVT. Continue IV heparin. Vascular consult pending. DOAC on d/c. See also Acute Pe  Acute pulmonary embolism (HCC) Continue IV heparin Vascular Surgery consulted - likely for thrombectomy tomorrow DOAC at discharge Wean O2 as tolerated, maintain O2 sat > 90% Telemetry  Type 2 diabetes mellitus with hyperlipidemia (HCC) Last A1c on record from 05/28/22 was 8.2%, poorly controlled. Sliding scale Nov[employer] glipizide (pt states has not been taking meds) Repeat A1c is pending PCP follow up   Mixed hyperlipidemia Continue  statin  Essential hypertension Continue home meds, lisinopril and HCTZ  Anemia of chronic disease Stable.  Monitor Hbg.        Subjective: Pt seen in ED, holding for a bed, daughter at bedside.  He reports improvement in his dyspnea. Denies any chest pain. Confirms not on oxygen at baseline. Reports his good friend had covid before he got sick, but he never tested for it.  Has been very sedentary during this time. No prior history or family history of blood clots that he knows of.  Physical Exam: Vitals:   09/16/22 0700 09/16/22 0758 09/16/22 0759 09/16/22 1136  BP:  124/60  (!) 142/69  Pulse: 76 72 73 77  Resp: 17 16 15  (!) 21  Temp:    98 F (36.7 C)  TempSrc:      SpO2: 96% 97% 97% 96%  Weight:      Height:       General exam: awake, alert, no acute distress HEENT: atraumatic, clear conjunctiva, anicteric sclera, moist mucus membranes, hearing grossly normal  Respiratory system: CTAB, no wheezes, rales or rhonchi, normal respiratory effort on 2 L/min O2 with sat 96-97%. Cardiovascular system: normal S1/S2, RRR, no JVD, murmurs, rubs, gallops, no pedal edema.   Gastrointestinal system: soft, NT, ND, no HSM felt, +bowel sounds. Central nervous system: A&O x4. no gross focal neurologic deficits, normal speech Extremities: no calf or popliteal tenderness on palpation, moves all, no edema, normal tone Skin: dry, intact, normal temperature, normal color, No rashes, lesions or ulcers Psychiatry: normal  mood, congruent affect, judgement and insight appear normal   Data Reviewed:  Notable labs and studies --- Hbg 12.7 from 14.0. glucose 169, gap 16    Family Communication: daughter at bedside on rounds  Disposition: Status is: Inpatient Remains inpatient appropriate because: remains on IV heparin, possible procedure tomorrow, weaning off oxygen   Planned Discharge Destination: Home    Time spent: 42 minutes  Author: Ezekiel Slocumb, DO 09/16/2022 1:17 PM  For on  call review [email or web address].

## 2022-09-16 NOTE — Assessment & Plan Note (Addendum)
Last A1c on record from 05/28/22 was 8.2%, poorly controlled. Sliding scale Nov[employer] glipizide (pt states has not been taking meds) Repeat A1c is pending PCP follow up

## 2022-09-16 NOTE — Assessment & Plan Note (Signed)
Stable.  Monitor Hbg.

## 2022-09-16 NOTE — Progress Notes (Signed)
Inpatient Diabetes Program Recommendations  AACE/ADA: New Consensus Statement on Inpatient Glycemic Control (2015)  Target Ranges:  Prepandial:   less than 140 mg/dL      Peak postprandial:   less than 180 mg/dL (1-2 hours)      Critically ill patients:  140 - 180 mg/dL   Lab Results  Component Value Date   GLUCAP 219 (H) 09/16/2022    Review of Glycemic Control  Latest Reference Range & Units 09/15/22 23:15 09/16/22 08:40 09/16/22 12:06  Glucose-Capillary 70 - 99 mg/dL 172 (H) 214 (H) 219 (H)   Diabetes history: DM Outpatient Diabetes medications:  Glucotrol 5 mg daily Current orders for Inpatient glycemic control:  Novolog 0-15 units tid with meals and HS Inpatient Diabetes Program Recommendations:   Consider adding Semglee 10 units daily.    Thanks,  Adah Perl, RN, BC-ADM Inpatient Diabetes Coordinator Pager [phone]  (8a-5p)

## 2022-09-16 NOTE — Progress Notes (Signed)
*  PRELIMINARY RESULTS* Echocardiogram 2D Echocardiogram has been performed.  Sherrie Sport 09/16/2022, 12:47 PM

## 2022-09-16 NOTE — Hospital Course (Signed)
HPI on admission on 3/24 PM, by Dr. Jonelle Sidle: "Derek [name] is a 77 y.o. male with medical history significant of diabetes, hypertension, hyperlipidemia, who lives at home and had recent upper respite tract infection.  She suspected she had some viral infection but not sure what.  It was not treated or evaluated for viruses including COVID-19 at the time.  Patient has been having shortness of breath for the last 7 days.  Today she was found to be hypoxic on room air with oxygen sats of 86%.  She was placed on 2 L and brought here by EMS.  She has had some cough within the last 7 days.  Here with her family who are giving history.  Workup so far showed normal chest x-ray but CT angiogram of the chest showed significant pulmonary embolism.  Patient is being admitted for further evaluation and treatment. "  He was started on IV heparin and vascular surgery was consulted  3/25 -- Doppler U/S positive for LLE peroneal DVT below the knee.

## 2022-09-17 ENCOUNTER — Other Ambulatory Visit (HOSPITAL_COMMUNITY): Payer: Self-pay

## 2022-09-17 DIAGNOSIS — J9601 Acute respiratory failure with hypoxia: Secondary | ICD-10-CM | POA: Diagnosis not present

## 2022-09-17 LAB — CBC
HCT: 37 % — ABNORMAL LOW (ref 39.0–52.0)
Hemoglobin: 12.4 g/dL — ABNORMAL LOW (ref 13.0–17.0)
MCH: 29.1 pg (ref 26.0–34.0)
MCHC: 33.5 g/dL (ref 30.0–36.0)
MCV: 86.9 fL (ref 80.0–100.0)
Platelets: 247 10*3/uL (ref 150–400)
RBC: 4.26 MIL/uL (ref 4.22–5.81)
RDW: 13.6 % (ref 11.5–15.5)
WBC: 6.9 10*3/uL (ref 4.0–10.5)
nRBC: 0 % (ref 0.0–0.2)

## 2022-09-17 LAB — GLUCOSE, CAPILLARY
Glucose-Capillary: 169 mg/dL — ABNORMAL HIGH (ref 70–99)
Glucose-Capillary: 170 mg/dL — ABNORMAL HIGH (ref 70–99)
Glucose-Capillary: 119 mg/dL — ABNORMAL HIGH (ref 70–99)
Glucose-Capillary: 136 mg/dL — ABNORMAL HIGH (ref 70–99)

## 2022-09-17 LAB — HEPARIN LEVEL (UNFRACTIONATED): Heparin Unfractionated: 0.46 IU/mL (ref 0.30–0.70)

## 2022-09-17 LAB — HEMOGLOBIN A1C
Hgb A1c MFr Bld: 9.6 % — ABNORMAL HIGH (ref 4.8–5.6)
Mean Plasma Glucose: 229 mg/dL

## 2022-09-17 NOTE — Progress Notes (Signed)
Progress Note   Patient: Derek [name] [id number] DOB: [birth year] DOA: 09/15/2022     2 DOS: the patient was seen and examined on 09/17/2022   Brief hospital course: HPI on admission on 3/24 PM, by Dr. Jonelle Sidle: "Derek [name] is a 77 y.o. male with medical history significant of diabetes, hypertension, hyperlipidemia, who lives at home and had recent upper respite tract infection.  She suspected she had some viral infection but not sure what.  It was not treated or evaluated for viruses including COVID-19 at the time.  Patient has been having shortness of breath for the last 7 days.  Today she was found to be hypoxic on room air with oxygen sats of 86%.  She was placed on 2 L and brought here by EMS.  She has had some cough within the last 7 days.  Here with her family who are giving history.  Workup so far showed normal chest x-ray but CT angiogram of the chest showed significant pulmonary embolism.  Patient is being admitted for further evaluation and treatment. "  He was started on IV heparin and vascular surgery was consulted  3/25 -- Doppler U/S positive for LLE peroneal DVT below the knee.  Assessment and Plan: * Acute respiratory failure with hypoxemia (HCC) Due to acute Pe's. Wean O2 as tolerated Mgmt of Pe's as outlined  Acute deep vein thrombosis (DVT) of left peroneal vein (HCC) Doppler U/S showed a Left below-the-knee peroneal vein DVT. Continue IV heparin. Vascular consulted - possible IVC filter today DOAC on d/c. See also Acute Pe  Pulmonary embolism (HCC) Continue IV heparin Vascular Surgery consulted - for thrombectomy today DOAC at discharge Wean O2 as tolerated, maintain O2 sat > 90% Telemetry  Type 2 diabetes mellitus with hyperlipidemia (HCC) Last A1c on record from 05/28/22 was 8.2%, poorly controlled.  A1c this admission is 9.6% --Sliding scale Novolog --Holding glipizide (pt states has not been taking meds) --PCP follow up   Mixed hyperlipidemia Continue  statin  Essential hypertension Continue home meds, lisinopril and HCTZ  Anemia of chronic disease Stable.  Monitor Hbg.        Subjective: Pt seen with family at bedside this AM.  Reports he feels well, better.  No CP or SOB at rest.  No acute complaints.  Having thrombectomy later today.  He is hungry (NPO).   Physical Exam: Vitals:   09/16/22 2301 09/17/22 0345 09/17/22 0824 09/17/22 1142  BP: 134/73 139/72 138/69 135/78  Pulse: 71 70 73 75  Resp: 18 18 16 14   Temp: 98 F (36.7 C) 97.8 F (36.6 C) 97.9 F (36.6 C) 98.2 F (36.8 C)  TempSrc:      SpO2: 99% 98% 100% 94%  Weight:      Height:       General exam: awake, alert, no acute distress HEENT: atraumatic, clear conjunctiva, anicteric sclera, moist mucus membranes, hearing grossly normal  Respiratory system: CTAB, no wheezes, rales or rhonchi, normal respiratory effort at rest. Cardiovascular system: RRR, no pedal edema.   Gastrointestinal system: soft, NT, ND Central nervous system: A&O x4. no gross focal neurologic deficits, normal speech Extremities: moves all, no edema, normal tone Skin: dry, intact, normal temperature Psychiatry: normal mood, congruent affect, judgement and insight appear normal   Data Reviewed:  Notable labs and studies --- Hbg 12.4 stable. No metabolic panel today  Family Communication: daughter at bedside on rounds  Disposition: Status is: Inpatient Remains inpatient appropriate because: remains on IV heparin, vascular procedure today,  weaning off oxygen   Planned Discharge Destination: Home    Time spent: 38 minutes  Author: Ezekiel Slocumb, DO 09/17/2022 12:58 PM  For on call review [email or web address].

## 2022-09-17 NOTE — Consult Note (Signed)
ANTICOAGULATION CONSULT NOTE   Pharmacy Consult for heparin infusion Indication: pulmonary embolus  No Active Allergies  Patient Measurements: Height: 5\' 9"  (175.3 cm) Weight: 111.1 kg (245 lb) IBW/kg (Calculated) : 70.7 Heparin Dosing Weight: 95.2 kg  Vital Signs: Temp: 97.8 F (36.6 C) (03/26 0345) BP: 139/72 (03/26 0345) Pulse Rate: 70 (03/26 0345)  Labs: Recent Labs    09/15/22 1930 09/15/22 2223 09/16/22 0514 09/16/22 1451 09/17/22 0537  HGB 14.0  --  12.7*  --  12.4*  HCT 43.3  --  38.2*  --  37.0*  PLT 308  --  263  --  247  APTT  --  168*  --   --   --   HEPARINUNFRC  --   --  0.43 0.42 0.46  CREATININE 1.08  --  0.92  --   --   TROPONINIHS 27*  --   --   --   --      Estimated Creatinine Clearance: 82.6 mL/min (by C-G formula based on SCr of 0.92 mg/dL).   Medical History: Past Medical History:  Diagnosis Date   Diabetes mellitus without complication ([place])    Hypertension    Mixed hyperlipidemia     Medications:  PTA: N/A Inpatient: heparin infusion 3/24 >>> Allergies: NKDA  Assessment: 77yo male presents to ED with hypoxia and persistent cough. CTA positive for multiple bilateral acute pulmonary emboli with no evidence of right heart strain. Pharmacy consulted for management of heparin infusion in the setting of pulmonary embolism  Date Time aPTT/HL Rate/Comment 3/25 0514 HL 0.43   Therapeutic x 1 3/25 1451 HL 0.42    Therapeutic x 2  3/26     0537   HL 0.46     Therapeutic X 3  Goal of Therapy:  Heparin level 0.3-0.7 units/ml Monitor platelets by anticoagulation protocol: Yes   Plan:  Continue heparin infusion at 1650 units/hr Check HL with AM labs Continue to monitor H&H and platelets daily while on heparin gtt.  Gerrod Maule D 09/17/2022 7:04 AM

## 2022-09-17 NOTE — TOC Initial Note (Signed)
Transition of Care [facility]) - Initial/Assessment Note    Patient Details  Name: Derek [name] MRN: [id number] Date of Birth: 28-Sep-1945  Transition of Care [facility]) CM/SW Contact:    Laurena Slimmer, RN Phone Number: 09/17/2022, 11:48 AM  Clinical Narrative:                  Transition of Care (TOC) Screening Note   Patient Details  Name: Derek [name] Date of Birth: 1945-08-01   Transition of [facility]) CM/SW Contact:    Laurena Slimmer, RN Phone Number: 09/17/2022, 11:49 AM    Transition of Care Department [facility]) has reviewed patient and no TOC needs have been identified at this time. We will continue to monitor patient advancement through interdisciplinary progression rounds. If new patient transition needs arise, please place a TOC consult.          Patient Goals and CMS Choice            Expected Discharge Plan and Services                                              Prior Living Arrangements/Services                       Activities of Daily Living Home Assistive Devices/Equipment: None ADL Screening (condition at time of admission) Patient's cognitive ability adequate to safely complete daily activities?: Yes Is the patient deaf or have difficulty hearing?: No Does the patient have difficulty seeing, even when wearing glasses/contacts?: No Does the patient have difficulty concentrating, remembering, or making decisions?: No Patient able to express need for assistance with ADLs?: Yes Does the patient have difficulty dressing or bathing?: No Independently performs ADLs?: Yes (appropriate for developmental age) Does the patient have difficulty walking or climbing stairs?: No Weakness of Legs: None Weakness of Arms/Hands: None  Permission Sought/Granted                  Emotional Assessment              Admission diagnosis:  Hypoxia [R09.02] Acute respiratory failure with hypoxemia ([place]) [J96.01] Pulmonary embolism, other,  unspecified chronicity, unspecified whether acute cor pulmonale present ([place]) [I26.99] Patient Active Problem List   Diagnosis Date Noted   Acute deep vein thrombosis (DVT) of left peroneal vein ([place]) 09/16/2022   Essential hypertension 09/15/2022   Mixed hyperlipidemia 09/15/2022   Type 2 diabetes mellitus with hyperlipidemia ([place]) 09/15/2022   Acute respiratory failure with hypoxemia ([place]) 09/15/2022   Pulmonary embolism ([place]) 09/15/2022   Anemia of chronic disease 06/04/2022   PCP:  Dion Body, MD Pharmacy:   [facility] [address] [place]) [place] [address] Phone: [phone] Fax: [phone]     Social Determinants of Health (SDOH) Social History: SDOH Screenings   Food Insecurity: No Food Insecurity (09/16/2022)  Housing: Low Risk  (09/16/2022)  Transportation Needs: No Transportation Needs (09/16/2022)  Utilities: Not At Risk (09/16/2022)  Tobacco Use: Medium Risk (09/15/2022)   SDOH Interventions:     Readmission Risk Interventions     No data to display

## 2022-09-17 NOTE — Inpatient Diabetes Management (Addendum)
Inpatient Diabetes Program Recommendations  AACE/ADA: New Consensus Statement on Inpatient Glycemic Control   Target Ranges:  Prepandial:   less than 140 mg/dL      Peak postprandial:   less than 180 mg/dL (1-2 hours)      Critically ill patients:  140 - 180 mg/dL    Latest Reference Range & Units 09/16/22 08:40 09/16/22 12:06 09/16/22 17:09 09/16/22 20:16 09/17/22 08:24  Glucose-Capillary 70 - 99 mg/dL 214 (H) 219 (H) 214 (H) 172 (H) 169 (H)    Latest Reference Range & Units 09/15/22 19:29  Hemoglobin A1C 4.8 - 5.6 % 9.6 (H)   Review of Glycemic Control  Diabetes history: DM2 Outpatient Diabetes medications: Glipizide 5 mg daily (not taking) Current orders for Inpatient glycemic control: Novolog 0-15 units TID with meals, Novolog 0-5 units QHS  Inpatient Diabetes Program Recommendations:    Oral DM medication: If appropriate, may want to consider ordering Glipizide 5 mg daily (as prescribed as outpatient) to determine how glucose trends with adding oral DM medication.  HbgA1C:  A1C 9.6% on 09/17/22 indicating an average glucose of 229 mg/dl over the past 2-3 months. Patient will need to be strongly encouraged to take DM medication outpatient.  Outpatient DM: At discharge, please provide Rx for glucose monitoring kit [id number]). May also want to prescribe oral DM medication to be sure patient is able to get the medication so he can start taking it.  NOTE: Patient admitted with respiratory distress, bilateral pulmonary emboli, and DVT. Per note on 09/16/22 by Dr. Arbutus Ped, patient stated he was not taking DM meds. In reviewing chart noted patient seen PCP on 07/22/22 and A1C was 8.2% on 05/28/22 and "Patient is adamant that he is not going to start on glipizide."  Addendum 09/17/22@12 :32- Spoke with patient, his daughter, his grandson, and friend (with patient's permission) at bedside about diabetes and home regimen for diabetes control. Patient reports being followed by PCP for borderline  DM. Patient states that he has not ever taken any medications for DM and he didn't need them. Patient confirms that his PCP prescribed Glipizide but he never started taking it. Patient states he was given a glucometer kit when he was prescribed Glipizide by his PCP but he has no idea what he done with it. Patient states that he retired 3 years ago and he is not very active and he also notes that he eats out a lot and eats candy and sweets very often.  Discussed A1C results (9.6% on 09/17/22) and explained that current A1C indicates an average glucose of 229 mg/dl over the past 2-3 months. Explained that patient does not have borderline DM, he actually has DM2.  Discussed glucose and A1C goals. Discussed importance of checking CBGs and maintaining good CBG control to prevent long-term and short-term complications. Explained how hyperglycemia leads to damage within blood vessels which lead to the common complications seen with uncontrolled diabetes. Stressed to the patient the importance of improving glycemic control to prevent further complications from uncontrolled diabetes. Discussed impact of nutrition, exercise, stress, sickness, and medications on diabetes control.   Patient reports that he eats out for breakfast every day at [place] and eats out again for lunch or supper. Patient states he drinks mainly diet sodas and water, he eats a lot of bread, potatoes, and pinto beans. Discussed carbohydrates and encouraged patient to limit serving size of foods high in carbohydrates and to eat them in moderation. Encouraged patient to cut out candy and cut back on sweets  he is consuming. Encouraged patient to become active with at least 30 mins a day of activity. Discussed Glipizide and how it works; asked patient if he would start taking the Glipizide and he states he will now consider taking it as his PCP has prescribed.  Encouraged patient to get a new glucometer (will need Rx) and to start checking his glucose at  home. Patient reports that his wife had DM and he checked her glucose and gave her insulin; she passed away right after he retired 3 years ago.  Encouraged patient to check glucose at least 1 time per day  and to keep a log book of glucose readings to share with PCP. Explained how PCP could use the glucose values to continue to make adjustments with DM medications if needed.  Patient states he will consider making some changes with diet, checking glucose, and taking DM medications.  Patient verbalized understanding of information discussed and reports no further questions at this time related to diabetes.    Thanks, Barnie Alderman, RN, MSN, [place] Diabetes Coordinator Inpatient Diabetes Program [phone] (Team Pager from 8am to [place])

## 2022-09-17 NOTE — Plan of Care (Signed)
Patient is participating in plan of care and progressing towards goals of discharge.  Celine Ahr, RN    Problem: Education: Goal: Ability to describe self-care measures that may prevent or decrease complications (Diabetes Survival Skills Education) will improve Outcome: Progressing Goal: Individualized Educational Video(s) Outcome: Progressing   Problem: Coping: Goal: Ability to adjust to condition or change in health will improve Outcome: Progressing   Problem: Fluid Volume: Goal: Ability to maintain a balanced intake and output will improve Outcome: Progressing   Problem: Health Behavior/Discharge Planning: Goal: Ability to identify and utilize available resources and services will improve Outcome: Progressing Goal: Ability to manage health-related needs will improve Outcome: Progressing   Problem: Metabolic: Goal: Ability to maintain appropriate glucose levels will improve Outcome: Progressing   Problem: Nutritional: Goal: Maintenance of adequate nutrition will improve Outcome: Progressing Goal: Progress toward achieving an optimal weight will improve Outcome: Progressing   Problem: Skin Integrity: Goal: Risk for impaired skin integrity will decrease Outcome: Progressing   Problem: Tissue Perfusion: Goal: Adequacy of tissue perfusion will improve Outcome: Progressing   Problem: Education: Goal: Knowledge of General Education information will improve Description: Including pain rating scale, medication(s)/side effects and non-pharmacologic comfort measures Outcome: Progressing   Problem: Health Behavior/Discharge Planning: Goal: Ability to manage health-related needs will improve Outcome: Progressing   Problem: Clinical Measurements: Goal: Ability to maintain clinical measurements within normal limits will improve Outcome: Progressing Goal: Will remain free from infection Outcome: Progressing Goal: Diagnostic test results will improve Outcome:  Progressing Goal: Respiratory complications will improve Outcome: Progressing Goal: Cardiovascular complication will be avoided Outcome: Progressing   Problem: Activity: Goal: Risk for activity intolerance will decrease Outcome: Progressing   Problem: Nutrition: Goal: Adequate nutrition will be maintained Outcome: Progressing   Problem: Coping: Goal: Level of anxiety will decrease Outcome: Progressing   Problem: Elimination: Goal: Will not experience complications related to bowel motility Outcome: Progressing Goal: Will not experience complications related to urinary retention Outcome: Progressing   Problem: Pain Managment: Goal: General experience of comfort will improve Outcome: Progressing   Problem: Safety: Goal: Ability to remain free from injury will improve Outcome: Progressing   Problem: Skin Integrity: Goal: Risk for impaired skin integrity will decrease Outcome: Progressing

## 2022-09-17 NOTE — TOC Benefit Eligibility Note (Signed)
Patient Teacher, English as a foreign language completed.    The patient is currently admitted and upon discharge could be taking Eliquis 5 mg.  The current 30 day co-pay is $47.00.   The patient is currently admitted and upon discharge could be taking Xarelto 20 mg.  The current 30 day co-pay is $47.00.   The patient is insured through [employer] Part D   This test claim was processed through [facility] amounts may vary at other pharmacies due to pharmacy/plan contracts, or as the patient moves through the different stages of their insurance plan.  Lyndel Safe, [place] Patient Advocate Specialist [place] Patient Advocate Team Direct Number: [phone]  Fax: [phone]

## 2022-09-18 ENCOUNTER — Encounter
Admission: EM | Disposition: A | Discharge status: Home / Self Care | Payer: Self-pay | Source: Home / Self Care | Attending: Internal Medicine

## 2022-09-18 DIAGNOSIS — R0902 Hypoxemia: Principal | ICD-10-CM

## 2022-09-18 DIAGNOSIS — Z7901 Long term (current) use of anticoagulants: Secondary | ICD-10-CM | POA: Diagnosis not present

## 2022-09-18 DIAGNOSIS — J9601 Acute respiratory failure with hypoxia: Secondary | ICD-10-CM | POA: Diagnosis not present

## 2022-09-18 DIAGNOSIS — I2699 Other pulmonary embolism without acute cor pulmonale: Secondary | ICD-10-CM | POA: Diagnosis not present

## 2022-09-18 DIAGNOSIS — I82409 Acute embolism and thrombosis of unspecified deep veins of unspecified lower extremity: Secondary | ICD-10-CM | POA: Diagnosis not present

## 2022-09-18 HISTORY — PX: IVC FILTER INSERTION: CATH118245

## 2022-09-18 HISTORY — PX: PULMONARY THROMBECTOMY: CATH118295

## 2022-09-18 LAB — CBC
HCT: 37.2 % — ABNORMAL LOW (ref 39.0–52.0)
Hemoglobin: 12.4 g/dL — ABNORMAL LOW (ref 13.0–17.0)
MCH: 29.5 pg (ref 26.0–34.0)
MCHC: 33.3 g/dL (ref 30.0–36.0)
MCV: 88.4 fL (ref 80.0–100.0)
Platelets: 257 10*3/uL (ref 150–400)
RBC: 4.21 MIL/uL — ABNORMAL LOW (ref 4.22–5.81)
RDW: 13.6 % (ref 11.5–15.5)
WBC: 7.6 10*3/uL (ref 4.0–10.5)
nRBC: 0 % (ref 0.0–0.2)

## 2022-09-18 LAB — BASIC METABOLIC PANEL
Anion gap: 14 (ref 5–15)
BUN: 13 mg/dL (ref 8–23)
CO2: 25 mmol/L (ref 22–32)
Calcium: 8.8 mg/dL — ABNORMAL LOW (ref 8.9–10.3)
Chloride: 102 mmol/L (ref 98–111)
Creatinine, Ser: 0.95 mg/dL (ref 0.61–1.24)
GFR, Estimated: >60 mL/min (ref >60)
Glucose, Bld: 142 mg/dL — ABNORMAL HIGH (ref 70–99)
Potassium: 3.6 mmol/L (ref 3.5–5.1)
Sodium: 141 mmol/L (ref 135–145)

## 2022-09-18 LAB — GLUCOSE, CAPILLARY
Glucose-Capillary: 143 mg/dL — ABNORMAL HIGH (ref 70–99)
Glucose-Capillary: 132 mg/dL — ABNORMAL HIGH (ref 70–99)
Glucose-Capillary: 102 mg/dL — ABNORMAL HIGH (ref 70–99)
Glucose-Capillary: 127 mg/dL — ABNORMAL HIGH (ref 70–99)
Glucose-Capillary: 118 mg/dL — ABNORMAL HIGH (ref 70–99)

## 2022-09-18 LAB — HEPARIN LEVEL (UNFRACTIONATED): Heparin Unfractionated: 0.44 IU/mL (ref 0.30–0.70)

## 2022-09-18 SURGERY — PULMONARY THROMBECTOMY
Anesthesia: Moderate Sedation

## 2022-09-18 MED ORDER — MIDAZOLAM HCL 2 MG/2ML IJ SOLN
INTRAMUSCULAR | Status: DC | PRN
  Administered 2022-09-18: 2 mg via INTRAVENOUS

## 2022-09-18 MED ORDER — CEFAZOLIN SODIUM-DEXTROSE 2-4 GM/100ML-% IV SOLN
INTRAVENOUS | Status: AC
  Filled 2022-09-18: qty 100

## 2022-09-18 MED ORDER — FENTANYL CITRATE PF 50 MCG/ML IJ SOSY
PREFILLED_SYRINGE | INTRAMUSCULAR | Status: AC
  Filled 2022-09-18: qty 1

## 2022-09-18 MED ORDER — IODIXANOL 320 MG/ML IV SOLN
INTRAVENOUS | Status: DC | PRN
  Administered 2022-09-18: 70 mL

## 2022-09-18 MED ORDER — CEFAZOLIN SODIUM-DEXTROSE 1-4 GM/50ML-% IV SOLN
INTRAVENOUS | Status: DC | PRN
  Administered 2022-09-18: 2 g via INTRAVENOUS

## 2022-09-18 MED ORDER — FENTANYL CITRATE (PF) 100 MCG/2ML IJ SOLN
INTRAMUSCULAR | Status: DC | PRN
  Administered 2022-09-18: 50 ug via INTRAVENOUS

## 2022-09-18 MED ORDER — HEPARIN (PORCINE) 25000 UT/250ML-% IV SOLN
INTRAVENOUS | Status: AC
  Filled 2022-09-18: qty 250

## 2022-09-18 MED ORDER — ALTEPLASE 2 MG IJ SOLR
INTRAMUSCULAR | Status: DC | PRN
  Administered 2022-09-18: 6 mg
  Administered 2022-09-18: 2 mg

## 2022-09-18 MED ORDER — HEPARIN SODIUM (PORCINE) 1000 UNIT/ML IJ SOLN
INTRAMUSCULAR | Status: AC
  Filled 2022-09-18: qty 10

## 2022-09-18 MED ORDER — MIDAZOLAM HCL 5 MG/5ML IJ SOLN
INTRAMUSCULAR | Status: AC
  Filled 2022-09-18: qty 5

## 2022-09-18 MED ORDER — HEPARIN SODIUM (PORCINE) 1000 UNIT/ML IJ SOLN
INTRAMUSCULAR | Status: DC | PRN
  Administered 2022-09-18: 3000 [IU] via INTRAVENOUS

## 2022-09-18 MED ORDER — ACETAMINOPHEN 325 MG PO TABS: 650.0000 mg | ORAL_TABLET | Freq: Four times a day (QID) | ORAL | Status: DC | PRN

## 2022-09-18 MED ORDER — HEPARIN (PORCINE) 25000 UT/250ML-% IV SOLN
1650.0000 [IU]/h | INTRAVENOUS | Status: DC
  Administered 2022-09-19: 1650 [IU]/h via INTRAVENOUS
  Filled 2022-09-18: qty 250

## 2022-09-18 MED ORDER — ALTEPLASE 2 MG IJ SOLR
INTRAMUSCULAR | Status: AC
  Filled 2022-09-18: qty 8

## 2022-09-18 SURGICAL SUPPLY — 20 items
CANISTER PENUMBRA ENGINE (MISCELLANEOUS) IMPLANT
CATH INDIGO SEP 8 (CATHETERS) IMPLANT
CATH INFINITI 5FR ANG PIGTAIL (CATHETERS) IMPLANT
CATH INFINITI JR4 5F (CATHETERS) IMPLANT
CATH LIGHTNING 8 XTORQ 115 (CATHETERS) IMPLANT
CLOSURE PERCLOSE PROSTYLE (VASCULAR PRODUCTS) IMPLANT
DEVICE TORQUE .025-.038 (MISCELLANEOUS) IMPLANT
GLIDEWIRE ANGLED SS 035X260CM (WIRE) IMPLANT
NDL ENTRY 21GA 7CM ECHOTIP (NEEDLE) IMPLANT
NEEDLE ENTRY 21GA 7CM ECHOTIP (NEEDLE) ×2 IMPLANT
PACK ANGIOGRAPHY (CUSTOM PROCEDURE TRAY) ×2 IMPLANT
SET INTRO CAPELLA COAXIAL (SET/KITS/TRAYS/PACK) IMPLANT
SHEATH 9FRX11 (SHEATH) IMPLANT
SHEATH BRITE TIP 6FRX11 (SHEATH) IMPLANT
SUT MNCRL AB 4-0 PS2 18 (SUTURE) IMPLANT
SUT SILK 0 FSL (SUTURE) IMPLANT
SYR MEDRAD MARK 7 150ML (SYRINGE) IMPLANT
TUBING CONTRAST HIGH PRESS 72 (TUBING) IMPLANT
WIRE AMPLATZ SSTIFF .035X260CM (WIRE) IMPLANT
WIRE GUIDERIGHT .035X150 (WIRE) IMPLANT

## 2022-09-18 NOTE — Interval H&P Note (Signed)
History and Physical Interval Note:  09/18/2022 5:17 PM  Derek [name]  has presented today for surgery, with the diagnosis of Pulmonary Embolism.  The various methods of treatment have been discussed with the patient and family. After consideration of risks, benefits and other options for treatment, the patient has consented to  Procedure(s): PULMONARY THROMBECTOMY (Bilateral) IVC FILTER INSERTION (N/A) as a surgical intervention.  The patient's history has been reviewed, patient examined, no change in status, stable for surgery.  I have reviewed the patient's chart and labs.  Questions were answered to the patient's satisfaction.     [name]

## 2022-09-18 NOTE — H&P (View-Only) (Signed)
  Progress Note    09/18/2022 9:02 AM * No surgery date entered *  Subjective: Derek [name] is a 77 year old male who presented to the [facility] emergency department on 09/17/2022 with complaints of shortness of breath.  Evaluation endorses bilateral pulmonary embolisms.  Patient was scheduled for pulmonary thrombectomy on 09/17/2022 but was canceled due to an operative emergency.  Patient is rescheduled for today.  Patient remains on a heparin drip.  Patient remains on nasal cannula oxygen.  Vitals all remained stable overnight.  No other complaints.   Vitals:   09/18/22 0338 09/18/22 0848  BP: (!) 114/53 (!) 141/76  Pulse: 70 71  Resp: 18 18  Temp: 98.5 F (36.9 C) 98.2 F (36.8 C)  SpO2: 95% 96%   Physical Exam: Cardiac:  RRR Lungs: Nonlabored normal breathing, without rales rhonchi or wheezing.  Diminished throughout all fields. Incisions:  none Extremities: Positive palpable pulses throughout. Abdomen: Positive bowel sounds, soft, nontender, nondistended. Neurologic: Alert and oriented x 3, follows all commands, answers all questions appropriately.  CBC    Component Value Date/Time   WBC 7.6 09/18/2022 0516   RBC 4.21 (L) 09/18/2022 0516   HGB 12.4 (L) 09/18/2022 0516   HCT 37.2 (L) 09/18/2022 0516   PLT 257 09/18/2022 0516   MCV 88.4 09/18/2022 0516   MCH 29.5 09/18/2022 0516   MCHC 33.3 09/18/2022 0516   RDW 13.6 09/18/2022 0516   LYMPHSABS 1.1 09/15/2022 1930   MONOABS 0.7 09/15/2022 1930   EOSABS 0.1 09/15/2022 1930   BASOSABS 0.1 09/15/2022 1930    BMET    Component Value Date/Time   NA 141 09/18/2022 0516   K 3.6 09/18/2022 0516   CL 102 09/18/2022 0516   CO2 25 09/18/2022 0516   GLUCOSE 142 (H) 09/18/2022 0516   BUN 13 09/18/2022 0516   CREATININE 0.95 09/18/2022 0516   CALCIUM 8.8 (L) 09/18/2022 0516   GFRNONAA >60 09/18/2022 0516   GFRAA >60 01/30/2019 1555    INR No results found for: "INR"  No intake or output data in the 24 hours ending  09/18/22 0902   Assessment/Plan:  77 y.o. male is scheduled for the vascular lab later today for a pulmonary thrombectomy with possible IVC filter placement.  Patient is n.p.o. overnight.  Patient remains on a heparin drip until procedure.* No surgery date entered *   DVT prophylaxis: Heparin infusion.   Drema Pry [facility] 09/18/2022 9:02 AM

## 2022-09-18 NOTE — Progress Notes (Signed)
  Progress Note    09/18/2022 9:02 AM * No surgery date entered *  Subjective: Derek [name] is a 77 year old male who presented to the [facility] emergency department on 09/17/2022 with complaints of shortness of breath.  Evaluation endorses bilateral pulmonary embolisms.  Patient was scheduled for pulmonary thrombectomy on 09/17/2022 but was canceled due to an operative emergency.  Patient is rescheduled for today.  Patient remains on a heparin drip.  Patient remains on nasal cannula oxygen.  Vitals all remained stable overnight.  No other complaints.   Vitals:   09/18/22 0338 09/18/22 0848  BP: (!) 114/53 (!) 141/76  Pulse: 70 71  Resp: 18 18  Temp: 98.5 F (36.9 C) 98.2 F (36.8 C)  SpO2: 95% 96%   Physical Exam: Cardiac:  RRR Lungs: Nonlabored normal breathing, without rales rhonchi or wheezing.  Diminished throughout all fields. Incisions:  none Extremities: Positive palpable pulses throughout. Abdomen: Positive bowel sounds, soft, nontender, nondistended. Neurologic: Alert and oriented x 3, follows all commands, answers all questions appropriately.  CBC    Component Value Date/Time   WBC 7.6 09/18/2022 0516   RBC 4.21 (L) 09/18/2022 0516   HGB 12.4 (L) 09/18/2022 0516   HCT 37.2 (L) 09/18/2022 0516   PLT 257 09/18/2022 0516   MCV 88.4 09/18/2022 0516   MCH 29.5 09/18/2022 0516   MCHC 33.3 09/18/2022 0516   RDW 13.6 09/18/2022 0516   LYMPHSABS 1.1 09/15/2022 1930   MONOABS 0.7 09/15/2022 1930   EOSABS 0.1 09/15/2022 1930   BASOSABS 0.1 09/15/2022 1930    BMET    Component Value Date/Time   NA 141 09/18/2022 0516   K 3.6 09/18/2022 0516   CL 102 09/18/2022 0516   CO2 25 09/18/2022 0516   GLUCOSE 142 (H) 09/18/2022 0516   BUN 13 09/18/2022 0516   CREATININE 0.95 09/18/2022 0516   CALCIUM 8.8 (L) 09/18/2022 0516   GFRNONAA >60 09/18/2022 0516   GFRAA >60 01/30/2019 1555    INR No results found for: "INR"  No intake or output data in the 24 hours ending  09/18/22 0902   Assessment/Plan:  77 y.o. male is scheduled for the vascular lab later today for a pulmonary thrombectomy with possible IVC filter placement.  Patient is n.p.o. overnight.  Patient remains on a heparin drip until procedure.* No surgery date entered *   DVT prophylaxis: Heparin infusion.   Drema Pry [facility] 09/18/2022 9:02 AM

## 2022-09-18 NOTE — Op Note (Signed)
[facility]  Percutaneous Study/Intervention Procedural Note   Date of Surgery: 09/18/2022,7:35 PM  Surgeon:Marsi Turvey, Dolores Lory   Pre-operative Diagnosis: Symptomatic pulmonary emboli with right heart strain and hypoxia  Post-operative diagnosis:  Same  Procedure(s) Performed:  1.  Contrast injection right heart and bilateral pulmonary arteries  2.  Thrombolysis bilateral pulmonary arteries  3.  Mechanical thrombectomy bilateral lobar pulmonary arteries for removal of pulmonary emboli using the Penumbra CAT 8 thrombectomy catheter.  4.  Selective catheter placement right upper lobe pulmonary artery, middle lobe pulmonary artery and lower lobe pulmonary artery  5.  Selective catheter placement left upper lobe pulmonary artery and lower lobe pulmonary artery    Anesthesia: Conscious sedation was administered under my direct supervision by the interventional radiology RN. IV Versed plus fentanyl were utilized. Continuous ECG, pulse oximetry and blood pressure was monitored throughout the entire procedure.  Versed and fentanyl were administered intravenously.  Conscious sedation was administered for a total of 1 hour 13 minutes and 25 seconds.  Sheath: 9 French 11 cm Pinnacle sheath antegrade right femoral vein  Contrast: 70 cc   Fluoroscopy Time: 12.9 minutes  Indications:  Patient presents with pulmonary emboli. The patient is symptomatic with hypoxemia and dyspnea on exertion.  There is evidence of right heart strain on the CT angiogram. The patient is otherwise a good candidate for intervention and even the long-term benefits pulmonary angiography with thrombolysis is offered. The risks and benefits are reviewed long-term benefits are discussed. All questions are answered patient agrees to proceed.  Procedure:  Derek [name] a 77 y.o. male who was identified and appropriate procedural time out was performed.  The patient was then placed supine on the table and  prepped and draped in the usual sterile fashion.  Ultrasound was used to evaluate the right common femoral vein.  It was patent, as it was echolucent and compressible.  A digital ultrasound image was acquired for the permanent record.  A micropuncture needle was used to access the right common femoral vein under direct ultrasound guidance.  A microwire was then advanced under fluoroscopic guidance followed by micro-sheath.  A 0.035 J wire was advanced without resistance and a 5Fr sheath was placed and then upsized to an 8 [place] sheath.    The wire and pigtail catheter were then negotiated into the right atrium and bolus injection of contrast was utilized to demonstrate the right ventricle and the pulmonary artery outflow.   3000 units of heparin was then given and allowed to circulate.  TPA was reconstituted and delivered onto the table. A total of 8 milligrams of TPA was utilized.  2 mg was administered on the left side and 6 mg was administered on the right side. This was then allowed to dwell for 20-30 minutes.  The J-wire and pigtail catheter was advanced up to the right atrium where a bolus injection contrast was used to demonstrate the pulmonary artery outflow.  Stiff angled Glidewire was then exchanged for the J-wire and the pigtail catheter was exchanged for JR4 catheter. Using the combination of the stiff angled Glidewire and the JR4 catheter the pulmonary outflow track was selected.  The right pulmonary artery was addressed first.  A select catheter was then advanced over the Amplatz wire into the distal right main pulmonary artery and hand-injection confirmed the thrombus. 5 mg of tPA was then injected directly into the thrombus within the right distal main pulmonary artery.  After an appropriate dwell time the Amplatz wire  was reintroduced through the select catheter and the select catheter removed.  The Penumbra Cat 8 extra torque catheter was then advanced into the thrombus in the right  lower lobe pulmonary artery.  Hand-injection contrast was used to verify positioning and evaluate the distal anatomy.  Mechanical aspiration was performed using the CAT 8 catheter and a separator.  After multiple passes the catheter was then repositioned into the right middle lobe pulmonary artery and again hand-injection contrast was performed to verify position and evaluate the distal anatomy.  Multiple passes were made using mechanical aspiration in association with a separator.  Lastly, using a combination of the separator catheter and Glidewire the CAT 8 device was negotiated into the right upper lobe pulmonary artery.  Hand-injection of contrast was used to verify positioning and evaluate the distal anatomy.  Multiple passes were then performed using the separator with the CAT 8 penumbra catheter.  Once there was free flow of blood from all 3 lobar arteries the catheter was repositioned to the right main pulmonary artery.  Hand-injection contrast was then used to give an assessment of the effectiveness of thrombectomy.  Satisfied with the thrombectomy on the right, I then used the penumbra CAT 8 device as well as a Glidewire and an angled catheter to select the left main pulmonary artery  Then with the catheter in the left pulmonary artery bolus injection contrast was utilized to demonstrate the thrombus as well as the segmental pulmonary artery vasculature. This demonstrated thrombus in the distal left main pulmonary artery extending into the left upper lobe artery as well as the left lower lobe artery and noting that it was near occlusive.  The pigtail catheter was then advanced out so that it was positioned within the thrombus and 2 milligrams of TPA were infused directly into the clot.  After an appropriate dwell time the stiff glide wire was reintroduced through the CAT 8 catheter.  The Penumbra Cat 8 extra torque catheter was then advanced into the thrombus in the left lower lobe pulmonary artery.   Hand-injection contrast was used to verify the positioning and evaluate the distal anatomy.  Mechanical aspiration was performed using the CAT 8 catheter and a separator.  After multiple passes the catheter was then repositioned into the left upper lobe pulmonary artery and again hand-injection contrast was performed to verify positioning and evaluate the distal anatomy.  Multiple passes were made using mechanical aspiration in association with a separator.  Once there was free flow of blood from both lobar arteries the catheter was repositioned to the left main pulmonary artery.  Hand-injection contrast through the penumbra catheter was then performed to give an assessment of the left pulmonary vasculature and the effectiveness of thrombectomy.  After review these images the catheter and sheath were removed the Perclose device was secured and pressure held. There were no immediate complications.    Findings:   Right heart imaging:  Right atrium and right ventricle and the pulmonary outflow tract appears normal  Right lung: The right main pulmonary artery was free of thrombus.Thrombus was noted in the lobar arteries of the right lower, middle and upper lobes.  Thrombus extended into the segmental branches of all 3 lobes.  Following thrombectomy there is now resolution of nearly all the previously visualized thrombus.  Left lung:  The left main pulmonary artery was free of thrombus.Thrombus was noted in the lobar arteries of the left lower and upper lobes.  Thrombus extended into the segmental branches of both lobes.  Following thrombectomy there is now resolution of nearly all the previously visualized thrombus.    Disposition: Patient was taken to the recovery room in stable condition having tolerated the procedure well.  Derek [name] 09/18/2022,7:35 PM

## 2022-09-18 NOTE — TOC Progression Note (Signed)
Transition of Care [facility]) - Progression Note    Patient Details  Name: Derek [name] MRN: [id number] Date of Birth: [birth year]  [facility] CM/SW Contact  Laurena Slimmer, RN Phone Number: 09/18/2022, 2:53 PM  Clinical Narrative:    Case reviewed for DME needs and changes in discharge disposition.         Expected Discharge Plan and Services                                               Social Determinants of Health (SDOH) Interventions SDOH Screenings   Food Insecurity: No Food Insecurity (09/16/2022)  Housing: Low Risk  (09/16/2022)  Transportation Needs: No Transportation Needs (09/16/2022)  Utilities: Not At Risk (09/16/2022)  Tobacco Use: Medium Risk (09/15/2022)    Readmission Risk Interventions     No data to display

## 2022-09-18 NOTE — Progress Notes (Signed)
Progress Note   Patient: Derek [name] [id number] DOB: Jul 26, 1945 DOA: 09/15/2022     3 DOS: the patient was seen and examined on 09/18/2022   Brief hospital course: HPI on admission on 3/24 PM, by Dr. Jonelle Sidle: "Savonte Woolverton is a 77 y.o. male with medical history significant of diabetes, hypertension, hyperlipidemia, who lives at home and had recent upper respite tract infection.  She suspected she had some viral infection but not sure what.  It was not treated or evaluated for viruses including COVID-19 at the time.  Patient has been having shortness of breath for the last 7 days.  Today she was found to be hypoxic on room air with oxygen sats of 86%.  She was placed on 2 L and brought here by EMS.  She has had some cough within the last 7 days.  Here with her family who are giving history.  Workup so far showed normal chest x-ray but CT angiogram of the chest showed significant pulmonary embolism.  Patient is being admitted for further evaluation and treatment. "   He was started on IV heparin and vascular surgery was consulted   3/25 -- Doppler U/S positive for LLE peroneal DVT below the knee.  3/27: Scheduled for thrombectomy +/- IVC filter placement   Assessment and Plan: Acute respiratory failure with hypoxemia-resolved Due to acute PE.  Now on RA  Acute deep vein thrombosis (DVT) of left peroneal vein (HCC) Pulmonary embolism (HCC) Doppler U/S showed a Left below-the-knee peroneal vein DVT. Continue IV heparin. Vascular consulted -   -Thrombectomy and possible IVC filter placement 3/27. - DOAC on d/c.  Type 2 diabetes mellitus with hyperlipidemia (HCC) Last A1c on record from 05/28/22 was 8.2%, poorly controlled.  A1c this admission is 9.6% --Sliding scale Novolog --Holding glipizide (pt states has not been taking meds) --PCP follow up  Mixed hyperlipidemia Continue statin  Essential hypertension Continue home meds, lisinopril and HCTZ  Anemia of chronic disease Stable.   Monitor Hbg.  Subjective: Patient reports feeling hungry this morning as he is n.p.o. for his procedure.  He denies any chest pain or shortness of breath.  Physical Exam: Vitals:   09/17/22 1510 09/17/22 2012 09/17/22 2304 09/18/22 0338  BP: 127/79 130/72 135/70 (!) 114/53  Pulse: 69 77 74 70  Resp: 18 18 18 18   Temp:  97.7 F (36.5 C) 98.7 F (37.1 C) 98.5 F (36.9 C)  TempSrc:      SpO2: 94% 96% 96% 95%  Weight:      Height:       General exam: awake, alert, no acute distress HEENT: atraumatic, clear conjunctiva, anicteric sclera, moist mucus membranes, hearing grossly normal  Respiratory system: CTAB, no wheezes, rales or rhonchi, normal respiratory effort at rest. Cardiovascular system: RRR, no pedal edema.   Central nervous system: A&O x4. no gross focal neurologic deficits, normal speech Extremities: moves all, no edema, normal tone Skin: dry, intact, normal temperature Psychiatry: normal mood, congruent affect, judgement and insight appear normal   Data Reviewed: CBC    Component Value Date/Time   WBC 7.6 09/18/2022 0516   RBC 4.21 (L) 09/18/2022 0516   HGB 12.4 (L) 09/18/2022 0516   HCT 37.2 (L) 09/18/2022 0516   PLT 257 09/18/2022 0516   MCV 88.4 09/18/2022 0516   MCH 29.5 09/18/2022 0516   MCHC 33.3 09/18/2022 0516   RDW 13.6 09/18/2022 0516   LYMPHSABS 1.1 09/15/2022 1930   MONOABS 0.7 09/15/2022 1930   EOSABS 0.1 09/15/2022  1930   BASOSABS 0.1 09/15/2022 1930      Latest Ref Rng & Units 09/18/2022    5:16 AM 09/16/2022    5:14 AM 09/15/2022    7:30 PM  BMP  Glucose 70 - 99 mg/dL 142  169  247   BUN 8 - 23 mg/dL 13  15  19    Creatinine 0.61 - 1.24 mg/dL 0.95  0.92  1.08   Sodium 135 - 145 mmol/L 141  139  135   Potassium 3.5 - 5.1 mmol/L 3.6  3.6  3.8   Chloride 98 - 111 mmol/L 102  101  98   CO2 22 - 32 mmol/L 25  25  26    Calcium 8.9 - 10.3 mg/dL 8.8  9.0  8.9    Family Communication: daughter and friend at bedside on  rounds  Disposition: Status is: Inpatient Remains inpatient appropriate because: remains on IV heparin, vascular procedure today, weaning off oxygen   Planned Discharge Destination: Home    Time spent: 40 minutes  Author: Richarda Osmond, MD 09/18/2022 8:15 AM  For on call review [email or web address].

## 2022-09-18 NOTE — Care Management Important Message (Signed)
Important Message  Patient Details  Name: Derek [name] MRN: [id number] Date of Birth: August 08, 1945   Medicare Important Message Given:  Yes     Dannette Barbara 09/18/2022, 12:00 PM

## 2022-09-18 NOTE — Consult Note (Signed)
ANTICOAGULATION CONSULT NOTE   Pharmacy Consult for heparin infusion Indication: pulmonary embolus  No Active Allergies  Patient Measurements: Height: 5\' 9"  (175.3 cm) Weight: 111.1 kg (245 lb) IBW/kg (Calculated) : 70.7 Heparin Dosing Weight: 95.2 kg  Vital Signs: Temp: 98.5 F (36.9 C) (03/27 0338) BP: 114/53 (03/27 0338) Pulse Rate: 70 (03/27 0338)  Labs: Recent Labs    09/15/22 1930 09/15/22 1930 09/15/22 2223 09/16/22 0514 09/16/22 1451 09/17/22 0537 09/18/22 0516  HGB 14.0  --   --  12.7*  --  12.4* 12.4*  HCT 43.3  --   --  38.2*  --  37.0* 37.2*  PLT 308  --   --  263  --  247 257  APTT  --   --  168*  --   --   --   --   HEPARINUNFRC  --    < >  --  0.43 0.42 0.46 0.44  CREATININE 1.08  --   --  0.92  --   --  0.95  TROPONINIHS 27*  --   --   --   --   --   --    < > = values in this interval not displayed.     Estimated Creatinine Clearance: 80 mL/min (by C-G formula based on SCr of 0.95 mg/dL).   Medical History: Past Medical History:  Diagnosis Date   Diabetes mellitus without complication ([place])    Hypertension    Mixed hyperlipidemia     Medications:  PTA: N/A Inpatient: heparin infusion 3/24 >>> Allergies: NKDA  Assessment: 77yo male presents to ED with hypoxia and persistent cough. CTA positive for multiple bilateral acute pulmonary emboli with no evidence of right heart strain. Pharmacy consulted for management of heparin infusion in the setting of pulmonary embolism  Date Time aPTT/HL Rate/Comment 3/25 0514 HL 0.43   Therapeutic x 1 3/25 1451 HL 0.42    Therapeutic x 2  3/26     0537   HL 0.46     Therapeutic X 3  3/27     0516   HL 0.44     Therapeutic X 4  Goal of Therapy:  Heparin level 0.3-0.7 units/ml Monitor platelets by anticoagulation protocol: Yes   Plan:  Continue heparin infusion at 1650 units/hr Check HL with AM labs Continue to monitor H&H and platelets daily while on heparin gtt.  Sham Alviar D 09/18/2022 6:41  AM

## 2022-09-19 ENCOUNTER — Encounter: Payer: Self-pay | Admitting: Vascular Surgery

## 2022-09-19 DIAGNOSIS — I82409 Acute embolism and thrombosis of unspecified deep veins of unspecified lower extremity: Secondary | ICD-10-CM | POA: Diagnosis not present

## 2022-09-19 DIAGNOSIS — J9601 Acute respiratory failure with hypoxia: Secondary | ICD-10-CM | POA: Diagnosis not present

## 2022-09-19 DIAGNOSIS — I2699 Other pulmonary embolism without acute cor pulmonale: Secondary | ICD-10-CM | POA: Diagnosis not present

## 2022-09-19 LAB — CBC
HCT: 38.7 % — ABNORMAL LOW (ref 39.0–52.0)
Hemoglobin: 12.7 g/dL — ABNORMAL LOW (ref 13.0–17.0)
MCH: 29.1 pg (ref 26.0–34.0)
MCHC: 32.8 g/dL (ref 30.0–36.0)
MCV: 88.6 fL (ref 80.0–100.0)
Platelets: 259 10*3/uL (ref 150–400)
RBC: 4.37 MIL/uL (ref 4.22–5.81)
RDW: 13.7 % (ref 11.5–15.5)
WBC: 8.9 10*3/uL (ref 4.0–10.5)
nRBC: 0 % (ref 0.0–0.2)

## 2022-09-19 LAB — GLUCOSE, CAPILLARY
Glucose-Capillary: 129 mg/dL — ABNORMAL HIGH (ref 70–99)
Glucose-Capillary: 244 mg/dL — ABNORMAL HIGH (ref 70–99)

## 2022-09-19 LAB — HEPARIN LEVEL (UNFRACTIONATED): Heparin Unfractionated: 0.42 IU/mL (ref 0.30–0.70)

## 2022-09-19 MED ORDER — APIXABAN (ELIQUIS) VTE STARTER PACK (10MG AND 5MG): ORAL_TABLET | ORAL | 0 refills | Status: DC

## 2022-09-19 MED ORDER — APIXABAN 5 MG PO TABS: 5.0000 mg | ORAL_TABLET | Freq: Two times a day (BID) | ORAL | Status: DC

## 2022-09-19 MED ORDER — APIXABAN 5 MG PO TABS
10.0000 mg | ORAL_TABLET | Freq: Two times a day (BID) | ORAL | Status: DC
  Administered 2022-09-19: 10 mg via ORAL
  Filled 2022-09-19: qty 2

## 2022-09-19 NOTE — Discharge Summary (Signed)
Physician Discharge Summary  Patient: Derek [name] [id number] DOB: May 20, 1946   Code Status: Full Code Admit date: 09/15/2022 Discharge date: 09/19/2022 Disposition: Home, No home health services recommended PCP: Dion Body, MD  Recommendations for Outpatient Follow-up:  Follow up with PCP within 1-2 weeks Regarding general hospital follow up and preventative care Follow up with vascular surgery   Discharge Diagnoses:  Principal Problem:   Acute respiratory failure with hypoxemia ([place]) Active Problems:   Anemia of chronic disease   Essential hypertension   Mixed hyperlipidemia   Type 2 diabetes mellitus with hyperlipidemia ([place])   Pulmonary embolism ([place])   Acute deep vein thrombosis (DVT) of left peroneal vein (H[facility])   Hypoxia   Acute deep vein thrombosis (DVT) of lower extremity [facility])  Brief Hospital Course Summary: HPI on admission on 3/24 PM, by Dr. Jonelle Sidle: "Derek [name] is a 77 y.o. male with medical history significant of diabetes, hypertension, hyperlipidemia, who lives at home and had recent upper respite tract infection.  She suspected she had some viral infection but not sure what.  It was not treated or evaluated for viruses including COVID-19 at the time.  Patient has been having shortness of breath for the last 7 days.  Today she was found to be hypoxic on room air with oxygen sats of 86%.  She was placed on 2 L and brought here by EMS.  She has had some cough within the last 7 days.  Here with her family who are giving history.  Workup so far showed normal chest x-ray but CT angiogram of the chest showed significant pulmonary embolism.  Patient is being admitted for further evaluation and treatment. "   He was started on IV heparin and vascular surgery was consulted   3/25 -- Doppler U/S positive for LLE peroneal DVT below the knee.   3/27: angiogram and thrombectomy. 3/28: patient remained stable ORA post op with no chest pain or SOB. He tolerated  transitioning from heparin drip to doac and was discharged  Discharge Condition: Good, improved Recommended discharge diet: Regular healthy diet  Consultations: Vascular surgery   Procedures/Studies: Angiography Thrombectomy   Allergies as of 09/19/2022   No Known Allergies      Medication List     STOP taking these medications    ascorbic acid 500 MG tablet Commonly known as: VITAMIN C   aspirin EC 81 MG tablet   multivitamin capsule   VITAMIN D3 PO   zinc sulfate 220 (50 Zn) MG capsule       TAKE these medications    Apixaban Starter Pack (10mg  and 5mg ) Commonly known as: ELIQUIS STARTER PACK Take as directed on package: start with two-5mg  tablets twice daily for 7 days. On day 8, switch to one-5mg  tablet twice daily.   atorvastatin 40 MG tablet Commonly known as: LIPITOR Take 20 mg by mouth every other day.   lisinopril-hydrochlorothiazide 20-25 MG tablet Commonly known as: ZESTORETIC Take 1 tablet by mouth daily. 10-12.5       Subjective   Pt reports feeling well. Denies chest pain or SOB. He is able to tolerate normal exertion.   All questions and concerns were addressed at time of discharge.  Objective  Blood pressure 112/63, pulse 79, temperature 98.9 F (37.2 C), temperature source Oral, resp. rate 18, height 5\' 9"  (1.753 m), weight 107.5 kg, SpO2 95 %.   General: Pt is alert, awake, not in acute distress Cardiovascular: RRR, S1/S2 +, no rubs, no gallops Respiratory: CTA bilaterally,  no wheezing, no rhonchi Abdominal: Soft, NT, ND, bowel sounds + Extremities: no edema, no cyanosis  The results of significant diagnostics from this hospitalization (including imaging, microbiology, ancillary and laboratory) are listed below for reference.   Imaging studies: PERIPHERAL VASCULAR CATHETERIZATION  Result Date: 09/18/2022 See surgical note for result.  ECHOCARDIOGRAM COMPLETE  Result Date: 09/16/2022    ECHOCARDIOGRAM REPORT   Patient Name:    Derek [name] Date of Exam: 09/16/2022 Medical Rec #:  [id number]    Height:       69.0 in Accession #:    [id number]   Weight:       245.0 lb Date of Birth:  1945/06/25    BSA:          2.252 m Patient Age:    14 years     BP:           124/60 mmHg Patient Gender: M            HR:           73 bpm. Exam Location:  [facility] Procedure: 2D Echo, Cardiac Doppler and Color Doppler Indications:     Pulmonary Embolus I26.09  History:         Patient has no prior history of Echocardiogram examinations.                  Risk Factors:Hypertension.  Sonographer:     Sherrie Sport Referring Phys:  [place] Diagnosing Phys: [place]  1. Left ventricular ejection fraction, by estimation, is 50 to 55%. The left ventricle has low normal function. The left ventricle demonstrates global hypokinesis. The left ventricular internal cavity size was mildly dilated. There is mild concentric left ventricular hypertrophy. Left ventricular diastolic parameters are consistent with Grade I diastolic dysfunction (impaired relaxation).  2. Right ventricular systolic function is normal. The right ventricular size is normal.  3. Left atrial size was moderately dilated.  4. Right atrial size was mildly dilated.  5. The mitral valve is normal in structure. Trivial mitral valve regurgitation. No evidence of mitral stenosis.  6. The aortic valve is normal in structure. Aortic valve regurgitation is not visualized. No aortic stenosis is present.  7. The inferior vena cava is normal in size with greater than 50% respiratory variability, suggesting right atrial pressure of 3 mmHg. FINDINGS  Left Ventricle: Left ventricular ejection fraction, by estimation, is 50 to 55%. The left ventricle has low normal function. The left ventricle demonstrates global hypokinesis. The left ventricular internal cavity size was mildly dilated. There is mild concentric left ventricular hypertrophy. Left ventricular diastolic parameters are consistent with  Grade I diastolic dysfunction (impaired relaxation). Right Ventricle: The right ventricular size is normal. No increase in right ventricular wall thickness. Right ventricular systolic function is normal. Left Atrium: Left atrial size was moderately dilated. Right Atrium: Right atrial size was mildly dilated. Pericardium: There is no evidence of pericardial effusion. Mitral Valve: The mitral valve is normal in structure. Trivial mitral valve regurgitation. No evidence of mitral valve stenosis. MV peak gradient, 3.7 mmHg. The mean mitral valve gradient is 2.0 mmHg. Tricuspid Valve: The tricuspid valve is normal in structure. Tricuspid valve regurgitation is trivial. No evidence of tricuspid stenosis. Aortic Valve: The aortic valve is normal in structure. Aortic valve regurgitation is not visualized. No aortic stenosis is present. Aortic valve mean gradient measures 3.0 mmHg. Aortic valve peak gradient measures 5.7 mmHg. Aortic valve area, by VTI measures 2.66 cm. Pulmonic  Valve: The pulmonic valve was normal in structure. Pulmonic valve regurgitation is not visualized. No evidence of pulmonic stenosis. Aorta: The aortic root is normal in size and structure. Venous: The inferior vena cava is normal in size with greater than 50% respiratory variability, suggesting right atrial pressure of 3 mmHg. IAS/Shunts: No atrial level shunt detected by color flow Doppler.  LEFT VENTRICLE PLAX 2D LVIDd:         4.90 cm   Diastology LVIDs:         3.60 cm   LV e' medial:    7.18 cm/s LV PW:         1.30 cm   LV E/e' medial:  9.0 LV IVS:        1.40 cm   LV e' lateral:   10.30 cm/s LVOT diam:     2.10 cm   LV E/e' lateral: 6.3 LV SV:         59 LV SV Index:   26 LVOT Area:     3.46 cm  RIGHT VENTRICLE RV Basal diam:  2.80 cm RV Mid diam:    3.10 cm RV S prime:     9.68 cm/s TAPSE (M-mode): 2.3 cm LEFT ATRIUM           Index        RIGHT ATRIUM           Index LA diam:      3.10 cm 1.38 cm/m   RA Area:     21.40 cm LA Vol (A2C):  30.6 ml 13.59 ml/m  RA Volume:   55.50 ml  24.64 ml/m LA Vol (A4C): 33.9 ml 15.05 ml/m  AORTIC VALVE AV Area (Vmax):    2.70 cm AV Area (Vmean):   2.57 cm AV Area (VTI):     2.66 cm AV Vmax:           119.00 cm/s AV Vmean:          82.900 cm/s AV VTI:            0.221 m AV Peak Grad:      5.7 mmHg AV Mean Grad:      3.0 mmHg LVOT Vmax:         92.60 cm/s LVOT Vmean:        61.600 cm/s LVOT VTI:          0.170 m LVOT/AV VTI ratio: 0.77  AORTA Ao Root diam: 3.50 cm MITRAL VALVE                TRICUSPID VALVE MV Area (PHT): 3.23 cm     TR Peak grad:   32.9 mmHg MV Area VTI:   2.22 cm     TR Vmax:        287.00 cm/s MV Peak grad:  3.7 mmHg MV Mean grad:  2.0 mmHg     SHUNTS MV Vmax:       0.97 m/s     Systemic VTI:  0.17 m MV Vmean:      62.3 cm/s    Systemic Diam: 2.10 cm MV Decel Time: 235 msec MV E velocity: 64.50 cm/s MV A velocity: 105.00 cm/s MV E/A ratio:  0.61 Shaukat Khan Electronically signed by Neoma Laming Signature Date/Time: 09/16/2022/2:44:52 PM    Final    US Venous Img Lower Bilateral (DVT)  Result Date: 09/16/2022 CLINICAL DATA:  Pulmonary embolism. EXAM: Bilateral LOWER EXTREMITY VENOUS DOPPLER ULTRASOUND TECHNIQUE: Gray-scale sonography with compression, as well  as color and duplex ultrasound, were performed to evaluate the deep venous system(s) from the level of the common femoral vein through the popliteal and proximal calf veins. COMPARISON:  None Available. FINDINGS: VENOUS Normal compressibility of the common femoral, superficial femoral, and popliteal veins. Visualized portions of profunda femoral vein and great saphenous vein unremarkable. These have preserved response to augmentation. However below-the-knee on the left side in the peroneal vein is some occlusive thrombus. Limited views of the contralateral common femoral vein are unremarkable. OTHER None. Limitations: none IMPRESSION: No evidence of right lower extremity DVT. Left below-the-knee peroneal vein thrombus. Please  correlate with history of known pulmonary embolism Electronically Signed   By: Jill Side M.D.   On: 09/16/2022 12:05   CT Angio Chest PE W and/or Wo Contrast  Result Date: 09/15/2022 CLINICAL DATA:  Shortness of breath for 7 days.  Hypoxia. EXAM: CT ANGIOGRAPHY CHEST WITH CONTRAST TECHNIQUE: Multidetector CT imaging of the chest was performed using the standard protocol during bolus administration of intravenous contrast. Multiplanar CT image reconstructions and MIPs were obtained to evaluate the vascular anatomy. RADIATION DOSE REDUCTION: This exam was performed according to the departmental dose-optimization program which includes automated exposure control, adjustment of the mA and/or kV according to patient size and/or use of iterative reconstruction technique. CONTRAST:  28mL OMNIPAQUE IOHEXOL 350 MG/ML SOLN COMPARISON:  Chest radiograph 09/15/2022 FINDINGS: Cardiovascular: Good opacification of the central and segmental pulmonary arteries. Multiple filling defects are demonstrated in the distal right main pulmonary artery with extension into upper and lower lobe and middle lobe branch vessels. Additional filling defects in multiple segmental and subsegmental left upper and lower lobe branch vessels. Changes are consistent with acute pulmonary embolus. RV to LV ratio is 0.8 suggesting no evidence of right heart strain. Normal heart size. No pericardial effusions. Normal caliber thoracic aorta. No aortic dissection. Great vessel origins are patent. Mediastinum/Nodes: Esophagus is decompressed. No significant lymphadenopathy. Thyroid gland is unremarkable. Lungs/Pleura: Mild dependent changes in the lung bases. No airspace disease or consolidation. No pleural effusions. No pneumothorax. Upper Abdomen: Small cyst in segment 2 of the liver. No imaging follow-up is indicated. No acute abnormalities in the upper abdomen. Musculoskeletal: Degenerative changes in the spine. Review of the MIP images confirms the  above findings. IMPRESSION: Positive examination for multiple bilateral acute pulmonary emboli. No evidence of right heart strain. Critical Value/emergent results were called by telephone at the time of interpretation on 09/15/2022 at 9:10 pm to provider [facility] , who verbally acknowledged these results. Electronically Signed   By: Lucienne Capers M.D.   On: 09/15/2022 21:14   DG Chest 2 View  Result Date: 09/15/2022 CLINICAL DATA:  Shortness of breath for 7 days, hypoxia EXAM: CHEST - 2 VIEW COMPARISON:  None Available. FINDINGS: Upper normal heart size. Mediastinal contours and pulmonary vascularity normal. Minimal scarring RIGHT middle lobe and accentuation of markings LEFT mid lung unchanged. Lungs otherwise clear. No pulmonary infiltrate, pleural effusion, or pneumothorax. Osseous structures unremarkable. IMPRESSION: No acute abnormalities. Electronically Signed   By: Lavonia Dana M.D.   On: 09/15/2022 19:37    Labs: Basic Metabolic Panel: Recent Labs  Lab 09/15/22 1930 09/16/22 0514 09/18/22 0516  NA 135 139 141  K 3.8 3.6 3.6  CL 98 101 102  CO2 26 25 25   GLUCOSE 247* 169* 142*  BUN 19 15 13   CREATININE 1.08 0.92 0.95  CALCIUM 8.9 9.0 8.8*   CBC: Recent Labs  Lab 09/15/22 1930 09/16/22 0514 09/17/22  [id number] 09/18/22 0516 09/19/22 0731  WBC 10.7* 9.0 6.9 7.6 8.9  NEUTROABS 8.6*  --   --   --   --   HGB 14.0 12.7* 12.4* 12.4* 12.7*  HCT 43.3 38.2* 37.0* 37.2* 38.7*  MCV 89.6 88.4 86.9 88.4 88.6  PLT 308 263 247 257 259   Microbiology: Results for orders placed or performed during the hospital encounter of 09/15/22  Resp panel by RT-PCR (RSV, Flu A&B, Covid) Anterior Nasal Swab     Status: None   Collection Time: 09/15/22  7:30 PM   Specimen: Anterior Nasal Swab  Result Value Ref Range Status   SARS Coronavirus 2 by RT PCR NEGATIVE NEGATIVE Final    Comment: (NOTE) SARS-CoV-2 target nucleic acids are NOT DETECTED.  The SARS-CoV-2 RNA is generally detectable in  upper respiratory specimens during the acute phase of infection. The lowest concentration of SARS-CoV-2 viral copies this assay can detect is 138 copies/mL. A negative result does not preclude SARS-Cov-2 infection and should not be used as the sole basis for treatment or other patient management decisions. A negative result may occur with  improper specimen collection/handling, submission of specimen other than nasopharyngeal swab, presence of viral mutation(s) within the areas targeted by this assay, and inadequate number of viral copies(<138 copies/mL). A negative result must be combined with clinical observations, patient history, and epidemiological information. The expected result is Negative.  Fact Sheet for Patients:  EntrepreneurPulse.com.au  Fact Sheet for Healthcare Providers:  IncredibleEmployment.be  This test is no t yet approved or cleared by the [place] FDA and  has been authorized for detection and/or diagnosis of SARS-CoV-2 by FDA under an Emergency Use Authorization (EUA). This EUA will remain  in effect (meaning this test can be used) for the duration of the COVID-19 declaration under Section 564(b)(1) of the Act, 21 U.S.C.section 360bbb-3(b)(1), unless the authorization is terminated  or revoked sooner.       Influenza A by PCR NEGATIVE NEGATIVE Final   Influenza B by PCR NEGATIVE NEGATIVE Final    Comment: (NOTE) The Xpert Xpress SARS-CoV-2/FLU/RSV plus assay is intended as an aid in the diagnosis of influenza from Nasopharyngeal swab specimens and should not be used as a sole basis for treatment. Nasal washings and aspirates are unacceptable for Xpert Xpress SARS-CoV-2/FLU/RSV testing.  Fact Sheet for Patients: EntrepreneurPulse.com.au  Fact Sheet for Healthcare Providers: IncredibleEmployment.be  This test is not yet approved or cleared by the [place] FDA and has been  authorized for detection and/or diagnosis of SARS-CoV-2 by FDA under an Emergency Use Authorization (EUA). This EUA will remain in effect (meaning this test can be used) for the duration of the COVID-19 declaration under Section 564(b)(1) of the Act, 21 U.S.C. section 360bbb-3(b)(1), unless the authorization is terminated or revoked.     Resp Syncytial Virus by PCR NEGATIVE NEGATIVE Final    Comment: (NOTE) Fact Sheet for Patients: EntrepreneurPulse.com.au  Fact Sheet for Healthcare Providers: IncredibleEmployment.be  This test is not yet approved or cleared by the [place] FDA and has been authorized for detection and/or diagnosis of SARS-CoV-2 by FDA under an Emergency Use Authorization (EUA). This EUA will remain in effect (meaning this test can be used) for the duration of the COVID-19 declaration under Section 564(b)(1) of the Act, 21 U.S.C. section 360bbb-3(b)(1), unless the authorization is terminated or revoked.  Performed at [facility], [address] [place], [place] [address]    Time coordinating discharge: Over 30 minutes  Taishaun Levels  Geanie Berlin, MD  [facility] 09/19/2022, 3:08 PM

## 2022-09-19 NOTE — Consult Note (Signed)
[place] for Apixaban Indication: pulmonary embolus  No Known Allergies  Patient Measurements: Height: 5\' 9"  (175.3 cm) Weight: 107.5 kg (236 lb 15.9 oz) IBW/kg (Calculated) : 70.7 Heparin Dosing Weight: 95.2 kg  Vital Signs: Temp: 98.9 F (37.2 C) (03/28 1123) Temp Source: Oral (03/28 1123) BP: 112/63 (03/28 1123) Pulse Rate: 79 (03/28 1123)  Labs: Recent Labs    09/17/22 0537 09/18/22 0516 09/19/22 0731  HGB 12.4* 12.4* 12.7*  HCT 37.0* 37.2* 38.7*  PLT 247 257 259  HEPARINUNFRC 0.46 0.44 0.42  CREATININE  --  0.95  --      Estimated Creatinine Clearance: 78.7 mL/min (by C-G formula based on SCr of 0.95 mg/dL).   Medical History: Past Medical History:  Diagnosis Date   Diabetes mellitus without complication ([place])    Hypertension    Mixed hyperlipidemia     Medications:  Scheduled:   atorvastatin  20 mg Oral QODAY   lisinopril  10 mg Oral Daily   And   hydrochlorothiazide  12.5 mg Oral Daily   insulin aspart  0-15 Units Subcutaneous TID WC   insulin aspart  0-5 Units Subcutaneous QHS   Infusions:   heparin 1,650 Units/hr (09/19/22 1212)   PRN: acetaminophen, morphine injection, ondansetron **OR** ondansetron (ZOFRAN) IV  Assessment: Derek [name] is a 77 y.o. male presenting with hypoxia and persistent cough. PMH significant for DM, HTN, HLD. Patient was not on [facility] PTA per chart review. CTA positive for multiple bilateral acute pulmonary emboli with no evidence of right heart strain. Patient underwent post mechanical pulmonary thrombectomy of bilateral pulmonary arteries on 3/27. Pharmacy has been consulted to dose apixaban.   Plan Discontinue heparin infusion Start apixaban 10 mg BID x7d then 5 mg BID thereafter Pharmacy will sign off and continue to monitor peripherally     Gretel Acre, PharmD PGY1 Pharmacy Resident 09/19/2022 12:33 PM

## 2022-09-19 NOTE — Consult Note (Signed)
[place] for heparin infusion Indication: pulmonary embolus  No Known Allergies  Patient Measurements: Height: 5\' 9"  (175.3 cm) Weight: 107.5 kg (236 lb 15.9 oz) IBW/kg (Calculated) : 70.7 Heparin Dosing Weight: 95.2 kg  Vital Signs: Temp: 98.3 F (36.8 C) (03/28 0752) Temp Source: Oral (03/28 0752) BP: 119/70 (03/28 0752) Pulse Rate: 69 (03/28 0752)  Labs: Recent Labs    09/17/22 0537 09/18/22 0516 09/19/22 0731  HGB 12.4* 12.4* 12.7*  HCT 37.0* 37.2* 38.7*  PLT 247 257 259  HEPARINUNFRC 0.46 0.44 0.42  CREATININE  --  0.95  --      Estimated Creatinine Clearance: 78.7 mL/min (by C-G formula based on SCr of 0.95 mg/dL).   Medical History: Past Medical History:  Diagnosis Date   Diabetes mellitus without complication ([place])    Hypertension    Mixed hyperlipidemia     Medications:  PTA: N/A Inpatient: heparin infusion 3/24 >>> Allergies: NKDA  Assessment: 77yo male presents to ED with hypoxia and persistent cough. CTA positive for multiple bilateral acute pulmonary emboli with no evidence of right heart strain. Pharmacy consulted for management of heparin infusion in the setting of pulmonary embolism.   Date Time aPTT/HL Rate/Comment 3/25 0514 HL 0.43   Therapeutic x 1 3/25 1451 HL 0.42    Therapeutic x 2  3/26     0537   HL 0.46     Therapeutic X 3  3/27     0516   HL 0.44     Therapeutic X 4  3/28 0731 HL 0.42     Therapeutic   Goal of Therapy:  Heparin level 0.3-0.7 units/ml Monitor platelets by anticoagulation protocol: Yes   Plan:  Heparin level is therapeutic. Will continue heparin infusion at 1650 units/hr. Recheck heparin level and CBC with AM labs.    Oswald Hillock, PharmD, BCPS 09/19/2022 8:17 AM

## 2022-09-19 NOTE — Plan of Care (Signed)
Patient is participating in goals of care to meet goals for discharge.  Celine Ahr, RN    Problem: Education: Goal: Ability to describe self-care measures that may prevent or decrease complications (Diabetes Survival Skills Education) will improve Outcome: Progressing Goal: Individualized Educational Video(s) Outcome: Progressing   Problem: Coping: Goal: Ability to adjust to condition or change in health will improve Outcome: Progressing   Problem: Fluid Volume: Goal: Ability to maintain a balanced intake and output will improve Outcome: Progressing   Problem: Health Behavior/Discharge Planning: Goal: Ability to identify and utilize available resources and services will improve Outcome: Progressing Goal: Ability to manage health-related needs will improve Outcome: Progressing   Problem: Metabolic: Goal: Ability to maintain appropriate glucose levels will improve Outcome: Progressing   Problem: Nutritional: Goal: Maintenance of adequate nutrition will improve Outcome: Progressing Goal: Progress toward achieving an optimal weight will improve Outcome: Progressing   Problem: Skin Integrity: Goal: Risk for impaired skin integrity will decrease Outcome: Progressing   Problem: Tissue Perfusion: Goal: Adequacy of tissue perfusion will improve Outcome: Progressing   Problem: Education: Goal: Knowledge of General Education information will improve Description: Including pain rating scale, medication(s)/side effects and non-pharmacologic comfort measures Outcome: Progressing   Problem: Health Behavior/Discharge Planning: Goal: Ability to manage health-related needs will improve Outcome: Progressing   Problem: Clinical Measurements: Goal: Ability to maintain clinical measurements within normal limits will improve Outcome: Progressing Goal: Will remain free from infection Outcome: Progressing Goal: Diagnostic test results will improve Outcome: Progressing Goal:  Respiratory complications will improve Outcome: Progressing Goal: Cardiovascular complication will be avoided Outcome: Progressing   Problem: Activity: Goal: Risk for activity intolerance will decrease Outcome: Progressing   Problem: Nutrition: Goal: Adequate nutrition will be maintained Outcome: Progressing   Problem: Coping: Goal: Level of anxiety will decrease Outcome: Progressing   Problem: Elimination: Goal: Will not experience complications related to bowel motility Outcome: Progressing Goal: Will not experience complications related to urinary retention Outcome: Progressing   Problem: Pain Managment: Goal: General experience of comfort will improve Outcome: Progressing   Problem: Safety: Goal: Ability to remain free from injury will improve Outcome: Progressing   Problem: Skin Integrity: Goal: Risk for impaired skin integrity will decrease Outcome: Progressing

## 2022-09-19 NOTE — Progress Notes (Signed)
  Progress Note    09/19/2022 12:28 PM 1 Day Post-Op  Subjective: Derek [name] is a 77 year old male now status post mechanical pulmonary thrombectomy of bilateral pulmonary arteries.  On exam this morning he is resting comfortably in bed eating breakfast.  Significant other is sitting at the bedside.  Patient states he is breathing better.  No complaints overnight.  Vitals all remained stable.  Heparin infusion is running.  Patient to be converted to oral anticoagulation.    Vitals:   09/19/22 0752 09/19/22 1123  BP: 119/70 112/63  Pulse: 69 79  Resp: 17 18  Temp: 98.3 F (36.8 C) 98.9 F (37.2 C)  SpO2: 98% 95%   Physical Exam: Cardiac:  RRR Lungs: Nonlabored normal breathing, without rales rhonchi or wheezing.  Diminished throughout all fields. Incisions: Right groin incision with dressing clean dry and intact.  No hematoma seroma to note. Extremities: Lateral lower extremities warm to touch with palpable pulses. Abdomen: Positive bowel sounds all 4 quadrants, soft, nontender, nondistended. Neurologic: No oriented x 4, answers all questions appropriately and follows all commands appropriately.  CBC    Component Value Date/Time   WBC 8.9 09/19/2022 0731   RBC 4.37 09/19/2022 0731   HGB 12.7 (L) 09/19/2022 0731   HCT 38.7 (L) 09/19/2022 0731   PLT 259 09/19/2022 0731   MCV 88.6 09/19/2022 0731   MCH 29.1 09/19/2022 0731   MCHC 32.8 09/19/2022 0731   RDW 13.7 09/19/2022 0731   LYMPHSABS 1.1 09/15/2022 1930   MONOABS 0.7 09/15/2022 1930   EOSABS 0.1 09/15/2022 1930   BASOSABS 0.1 09/15/2022 1930    BMET    Component Value Date/Time   NA 141 09/18/2022 0516   K 3.6 09/18/2022 0516   CL 102 09/18/2022 0516   CO2 25 09/18/2022 0516   GLUCOSE 142 (H) 09/18/2022 0516   BUN 13 09/18/2022 0516   CREATININE 0.95 09/18/2022 0516   CALCIUM 8.8 (L) 09/18/2022 0516   GFRNONAA >60 09/18/2022 0516   GFRAA >60 01/30/2019 1555    INR No results found for:  "INR"   Intake/Output Summary (Last 24 hours) at 09/19/2022 1228 Last data filed at 09/19/2022 1052 Gross per 24 hour  Intake 3844.55 ml  Output 1175 ml  Net 2669.55 ml     Assessment/Plan:  77 y.o. male is s/p mechanical pulmonary thrombectomy of bilateral pulmonary arteries.  1 Day Post-Op   PLAN: Vascular surgery will stop the heparin infusion this morning.  Patient to be converted to a oral anticoagulation 10 mg p.o. twice daily x 7 days then changed to 5 mg p.o. twice daily until further notice. Okay to discharge home later this afternoon once converted to oral anticoagulation per vascular surgery.  DVT prophylaxis: Heparin infusion converted to oral anticoagulants Eliquis 10 mg p.o. twice daily.   Drema Pry [facility] 09/19/2022 12:28 PM

## 2022-09-19 NOTE — Discharge Instructions (Signed)
Please take your eliquis as directed.  Follow up with vascular surgery as instructed.  Please see your primary care doctor within about 1-2 weeks to discuss general medical care

## 2022-09-19 NOTE — TOC Initial Note (Deleted)
Transition of Care [facility]) - Initial/Assessment Note    Patient Details  Name: Derek [name] MRN: [id number] Date of Birth: 01/01/1946  Transition of Care [facility]) CM/SW Contact:    Laurena Slimmer, RN Phone Number: 09/19/2022, 1:44 PM  Clinical Narrative:                  Transition of Care (TOC) Screening Note   Patient Details  Name: Derek [name] Date of Birth: Jun 17, 1946   Transition of Care [facility]) CM/SW Contact:    Laurena Slimmer, RN Phone Number: 09/19/2022, 1:44 PM    Transition of Care Department [facility]) has reviewed patient and no TOC needs have been identified at this time. We will continue to monitor patient advancement through interdisciplinary progression rounds. If new patient transition needs arise, please place a TOC consult.          Patient Goals and CMS Choice            Expected Discharge Plan and Services                                              Prior Living Arrangements/Services                       Activities of Daily Living Home Assistive Devices/Equipment: None ADL Screening (condition at time of admission) Patient's cognitive ability adequate to safely complete daily activities?: Yes Is the patient deaf or have difficulty hearing?: No Does the patient have difficulty seeing, even when wearing glasses/contacts?: No Does the patient have difficulty concentrating, remembering, or making decisions?: No Patient able to express need for assistance with ADLs?: Yes Does the patient have difficulty dressing or bathing?: No Independently performs ADLs?: Yes (appropriate for developmental age) Does the patient have difficulty walking or climbing stairs?: No Weakness of Legs: None Weakness of Arms/Hands: None  Permission Sought/Granted                  Emotional Assessment              Admission diagnosis:  Hypoxia [R09.02] Acute respiratory failure with hypoxemia ([place]) [J96.01] Pulmonary embolism, other,  unspecified chronicity, unspecified whether acute cor pulmonale present ([place]) [I26.99] Patient Active Problem List   Diagnosis Date Noted   Hypoxia 09/18/2022   Acute deep vein thrombosis (DVT) of lower extremity ([place]) 09/18/2022   Acute deep vein thrombosis (DVT) of left peroneal vein ([place]) 09/16/2022   Essential hypertension 09/15/2022   Mixed hyperlipidemia 09/15/2022   Type 2 diabetes mellitus with hyperlipidemia ([place]) 09/15/2022   Acute respiratory failure with hypoxemia ([place]) 09/15/2022   Pulmonary embolism ([place]) 09/15/2022   Anemia of chronic disease 06/04/2022   PCP:  Dion Body, MD Pharmacy:   [facility] [address]), [place]) [place] [address] Phone: [phone] Fax: [phone]     Social Determinants of Health (SDOH) Social History: SDOH Screenings   Food Insecurity: No Food Insecurity (09/16/2022)  Housing: Low Risk  (09/16/2022)  Transportation Needs: No Transportation Needs (09/16/2022)  Utilities: Not At Risk (09/16/2022)  Tobacco Use: Medium Risk (09/15/2022)   SDOH Interventions:     Readmission Risk Interventions     No data to display

## 2023-07-22 ENCOUNTER — Emergency Department: Payer: Medicare HMO

## 2023-07-22 ENCOUNTER — Emergency Department
Admission: EM | Admit: 2023-07-22 | Discharge: 2023-07-22 | Disposition: A | Discharge status: Home / Self Care | Payer: Medicare HMO | Attending: Emergency Medicine | Admitting: Emergency Medicine

## 2023-07-22 ENCOUNTER — Other Ambulatory Visit: Payer: Self-pay

## 2023-07-22 DIAGNOSIS — J09X2 Influenza due to identified novel influenza A virus with other respiratory manifestations: Secondary | ICD-10-CM | POA: Insufficient documentation

## 2023-07-22 DIAGNOSIS — Z20822 Contact with and (suspected) exposure to covid-19: Secondary | ICD-10-CM | POA: Diagnosis not present

## 2023-07-22 DIAGNOSIS — R059 Cough, unspecified: Secondary | ICD-10-CM | POA: Diagnosis present

## 2023-07-22 DIAGNOSIS — J101 Influenza due to other identified influenza virus with other respiratory manifestations: Secondary | ICD-10-CM

## 2023-07-22 LAB — RESP PANEL BY RT-PCR (RSV, FLU A&B, COVID)  RVPGX2
Influenza A by PCR: POSITIVE — AB
Influenza B by PCR: NEGATIVE
Resp Syncytial Virus by PCR: NEGATIVE
SARS Coronavirus 2 by RT PCR: NEGATIVE

## 2023-07-22 MED ORDER — ACETAMINOPHEN 325 MG PO TABS
650.0000 mg | ORAL_TABLET | Freq: Once | ORAL | Status: AC | PRN
  Administered 2023-07-22: 650 mg via ORAL
  Filled 2023-07-22: qty 2

## 2023-07-22 MED ORDER — ACETAMINOPHEN 500 MG PO TABS: 500.0000 mg | ORAL_TABLET | Freq: Four times a day (QID) | ORAL | Status: DC | PRN

## 2023-07-22 MED ORDER — OSELTAMIVIR PHOSPHATE 75 MG PO CAPS: 75.0000 mg | ORAL_CAPSULE | Freq: Two times a day (BID) | ORAL | 0 refills | Status: AC

## 2023-07-22 MED ORDER — PSEUDOEPH-BROMPHEN-DM 30-2-10 MG/5ML PO SYRP: 5.0000 mL | ORAL_SOLUTION | Freq: Four times a day (QID) | ORAL | 0 refills | Status: AC | PRN

## 2023-07-22 MED ORDER — CEFAZOLIN SODIUM-DEXTROSE 1-4 GM/50ML-% IV SOLN: 1.0000 g | Freq: Once | INTRAVENOUS | Status: DC

## 2023-07-22 MED ORDER — SODIUM CHLORIDE 0.9 % IV BOLUS: 500.0000 mL | Freq: Once | INTRAVENOUS | Status: DC

## 2023-07-22 NOTE — ED Triage Notes (Signed)
Pt reports feeling sick for about 4 days. Endorses some cough, congestion, runny nose, body aches. Denies any known fevers, CP, shob

## 2023-07-22 NOTE — Discharge Instructions (Addendum)
You have been diagnosed with influenza A.  Please drink plenty fluids, take Tamiflu as directed.  Please take cough syrup before bed.  Please come back to ED or go to your PCP if you have new symptoms or worsening symptoms.  Please take acetaminophen if you have fever of 100.4 or above.

## 2023-07-22 NOTE — ED Provider Triage Note (Signed)
Emergency Medicine Provider Triage Evaluation Note  Derek [name], a 78 y.o. male  was evaluated in triage.  Pt complains of presents to the ED with 4 days of flulike symptoms.  Patient Derek [name] is cough, congestion, runny nose, body aches.  No reports of chest pain or shortness of breath.  Review of Systems  Positive: Cough, congestion Negative: FCS  Physical Exam  BP 136/81   Pulse (!) 115   Temp (!) 100.8 F (38.2 C) (Oral)   Resp 18   Ht 5\' 9"  (1.753 m)   Wt 108.9 kg   SpO2 95%   BMI 35.44 kg/m  Gen:   Awake, no distress  NAD Resp:  Normal effort CTA MSK:   Moves extremities without difficulty  Other:    Medical Decision Making  Medically screening exam initiated at 7:47 PM.  Appropriate orders placed.  Derek [name] was informed that the remainder of the evaluation will be completed by another provider, this initial triage assessment does not replace that evaluation, and the importance of remaining in the ED until their evaluation is complete.  Geriatric patient to the ED for evaluation of flulike symptoms.   Derek Hoard, PA-C 07/22/23 2026

## 2023-07-22 NOTE — ED Provider Notes (Signed)
[facility] Provider Note    Event Date/Time   First MD Initiated Contact with Patient 07/22/23 2225     (approximate)   History   Cough   HPI  Derek [name] is a 78 y.o. male who presents today with history of 3 days of cough, fever, body aches.       Physical Exam   Triage Vital Signs: ED Triage Vitals [07/22/23 1936]  Encounter Vitals Group     BP 136/81     Systolic BP Percentile      Diastolic BP Percentile      Pulse Rate (!) 115     Resp 18     Temp (!) 100.8 F (38.2 C)     Temp Source Oral     SpO2 95 %     Weight 240 lb (108.9 kg)     Height 5\' 9"  (1.753 m)     Head Circumference      Peak Flow      Pain Score 4     Pain Loc      Pain Education      Exclude from Growth Chart     Most recent vital signs: Vitals:   07/22/23 1936 07/22/23 2300  BP: 136/81 113/70  Pulse: (!) 115 (!) 116  Resp: 18 16  Temp: (!) 100.8 F (38.2 C) 99.3 F (37.4 C)  SpO2: 95% 96%     Constitutional: Alert non distressed Eyes: Conjunctivae are normal.  Head: Atraumatic. Nose: No congestion/rhinnorhea. Mouth/Throat: Mucous membranes are moist.  No peritonsillar erythema Neck: Painless ROM.  Cardiovascular:   Good peripheral circulation. Respiratory: Normal respiratory effort.  No retractions.  No wheezing Gastrointestinal: Soft and nontender.  Musculoskeletal:  no deformity Neurologic:  MAE spontaneously. No gross focal neurologic deficits are appreciated.  Skin:  Skin is warm, dry and intact. No rash noted. Psychiatric: Mood and affect are normal. Speech and behavior are normal.    ED Results / Procedures / Treatments   Labs (all labs ordered are listed, but only abnormal results are displayed) Labs Reviewed  RESP PANEL BY RT-PCR (RSV, FLU A&B, COVID)  [id number] - Abnormal; Notable for the following components:      Result Value   Influenza A by PCR POSITIVE (*)    All other components within normal limits      EKG     RADIOLOGY I independently reviewed and interpreted imaging and agree with radiologists findings.      PROCEDURES:  Critical Care performed:   Procedures   MEDICATIONS ORDERED IN ED: Medications  acetaminophen (TYLENOL) tablet 650 mg (650 mg Oral Given 07/22/23 1940)     IMPRESSION / MDM / ASSESSMENT AND PLAN / ED COURSE  I reviewed the triage vital signs and the nursing notes.  Differential diagnosis includes, but is not limited to, COVID, influenza A, RSV, pneumonia  Patient's presentation is most consistent with acute complicated illness / injury requiring diagnostic workup.   Patient's diagnosis is consistent with influenza A. I independently reviewed and interpreted imaging and agree with radiologists findings. Labs are rea reassuring. I did review the patient's allergies and medications. Patient will be discharged home with prescriptions for Tamiflu, cough syrup. Patient is to follow up with PCP as needed or otherwise directed. Patient is given ED precautions to return to the ED for any worsening or new symptoms. Discussed plan of care with patient, answered all of patient's questions, Patient agreeable to plan of care. Advised patient to  take medications according to the instructions on the label. Discussed possible side effects of new medications. Patient verbalized understanding. Clinical Course as of 07/22/23 2313  Tue Jul 22, 2023  2309 DG Chest 2 View No active cardiopulmonary disease. [AE]    Clinical Course User Index [AE] Gladys Damme, PA-C     FINAL CLINICAL IMPRESSION(S) / ED DIAGNOSES   Final diagnoses:  Influenza A     Rx / DC Orders   ED Discharge Orders          Ordered    oseltamivir (TAMIFLU) 75 MG capsule  2 times daily        07/22/23 2311    brompheniramine-pseudoephedrine-DM 30-2-10 MG/5ML syrup  4 times daily PRN        07/22/23 2311             Note:  This document was prepared using Dragon voice  recognition software and may include unintentional dictation errors.   Gladys Damme, PA-C 07/22/23 2314    Janith Lima, MD 07/28/23 [phone]

## 2023-08-11 ENCOUNTER — Emergency Department: Payer: Medicare HMO

## 2023-08-11 ENCOUNTER — Observation Stay
Admission: EM | Admit: 2023-08-11 | Discharge: 2023-08-12 | Disposition: A | Discharge status: Home / Self Care | Payer: Medicare HMO | Attending: Hospitalist | Admitting: Hospitalist

## 2023-08-11 ENCOUNTER — Other Ambulatory Visit: Payer: Self-pay

## 2023-08-11 DIAGNOSIS — Z86711 Personal history of pulmonary embolism: Secondary | ICD-10-CM | POA: Insufficient documentation

## 2023-08-11 DIAGNOSIS — A0811 Acute gastroenteropathy due to Norwalk agent: Secondary | ICD-10-CM | POA: Diagnosis not present

## 2023-08-11 DIAGNOSIS — D72829 Elevated white blood cell count, unspecified: Secondary | ICD-10-CM | POA: Insufficient documentation

## 2023-08-11 DIAGNOSIS — Z1152 Encounter for screening for COVID-19: Secondary | ICD-10-CM | POA: Insufficient documentation

## 2023-08-11 DIAGNOSIS — A071 Giardiasis [lambliasis]: Secondary | ICD-10-CM | POA: Insufficient documentation

## 2023-08-11 DIAGNOSIS — E1169 Type 2 diabetes mellitus with other specified complication: Secondary | ICD-10-CM | POA: Diagnosis present

## 2023-08-11 DIAGNOSIS — I2699 Other pulmonary embolism without acute cor pulmonale: Secondary | ICD-10-CM | POA: Diagnosis present

## 2023-08-11 DIAGNOSIS — E785 Hyperlipidemia, unspecified: Secondary | ICD-10-CM | POA: Diagnosis present

## 2023-08-11 DIAGNOSIS — E119 Type 2 diabetes mellitus without complications: Secondary | ICD-10-CM | POA: Insufficient documentation

## 2023-08-11 DIAGNOSIS — K529 Noninfective gastroenteritis and colitis, unspecified: Secondary | ICD-10-CM | POA: Insufficient documentation

## 2023-08-11 DIAGNOSIS — R6520 Severe sepsis without septic shock: Secondary | ICD-10-CM | POA: Diagnosis not present

## 2023-08-11 DIAGNOSIS — A419 Sepsis, unspecified organism: Secondary | ICD-10-CM | POA: Diagnosis not present

## 2023-08-11 DIAGNOSIS — E782 Mixed hyperlipidemia: Secondary | ICD-10-CM | POA: Insufficient documentation

## 2023-08-11 DIAGNOSIS — Z7901 Long term (current) use of anticoagulants: Secondary | ICD-10-CM | POA: Diagnosis not present

## 2023-08-11 DIAGNOSIS — Z794 Long term (current) use of insulin: Secondary | ICD-10-CM | POA: Insufficient documentation

## 2023-08-11 DIAGNOSIS — Q433 Congenital malformations of intestinal fixation: Secondary | ICD-10-CM | POA: Insufficient documentation

## 2023-08-11 DIAGNOSIS — N390 Urinary tract infection, site not specified: Secondary | ICD-10-CM

## 2023-08-11 DIAGNOSIS — I10 Essential (primary) hypertension: Secondary | ICD-10-CM | POA: Diagnosis not present

## 2023-08-11 DIAGNOSIS — Z87891 Personal history of nicotine dependence: Secondary | ICD-10-CM | POA: Diagnosis not present

## 2023-08-11 DIAGNOSIS — R197 Diarrhea, unspecified: Secondary | ICD-10-CM

## 2023-08-11 DIAGNOSIS — R112 Nausea with vomiting, unspecified: Secondary | ICD-10-CM

## 2023-08-11 LAB — RESP PANEL BY RT-PCR (RSV, FLU A&B, COVID)  RVPGX2
Influenza A by PCR: NEGATIVE
Influenza B by PCR: NEGATIVE
Resp Syncytial Virus by PCR: NEGATIVE
SARS Coronavirus 2 by RT PCR: NEGATIVE

## 2023-08-11 LAB — CBG MONITORING, ED
Glucose-Capillary: 166 mg/dL — ABNORMAL HIGH (ref 70–99)
Glucose-Capillary: 159 mg/dL — ABNORMAL HIGH (ref 70–99)
Glucose-Capillary: 134 mg/dL — ABNORMAL HIGH (ref 70–99)

## 2023-08-11 LAB — URINALYSIS, ROUTINE W REFLEX MICROSCOPIC
Bacteria, UA: NONE SEEN
Bilirubin Urine: NEGATIVE
Glucose, UA: 150 mg/dL — AB
Hgb urine dipstick: NEGATIVE
Ketones, ur: 5 mg/dL — AB
Nitrite: NEGATIVE
Protein, ur: NEGATIVE mg/dL
Specific Gravity, Urine: >1.046 — ABNORMAL HIGH (ref 1.005–1.030)
WBC, UA: >50 WBC/hpf (ref 0–5)
pH: 5 (ref 5.0–8.0)

## 2023-08-11 LAB — CBC
HCT: 44 % (ref 39.0–52.0)
Hemoglobin: 14.9 g/dL (ref 13.0–17.0)
MCH: 30.7 pg (ref 26.0–34.0)
MCHC: 33.9 g/dL (ref 30.0–36.0)
MCV: 90.7 fL (ref 80.0–100.0)
Platelets: 280 10*3/uL (ref 150–400)
RBC: 4.85 MIL/uL (ref 4.22–5.81)
RDW: 14 % (ref 11.5–15.5)
WBC: 20 10*3/uL — ABNORMAL HIGH (ref 4.0–10.5)
nRBC: 0 % (ref 0.0–0.2)

## 2023-08-11 LAB — COMPREHENSIVE METABOLIC PANEL
ALT: 20 U/L (ref 0–44)
AST: 33 U/L (ref 15–41)
Albumin: 4.2 g/dL (ref 3.5–5.0)
Alkaline Phosphatase: 62 U/L (ref 38–126)
Anion gap: 21 — ABNORMAL HIGH (ref 5–15)
BUN: 21 mg/dL (ref 8–23)
CO2: 20 mmol/L — ABNORMAL LOW (ref 22–32)
Calcium: 9.3 mg/dL (ref 8.9–10.3)
Chloride: 97 mmol/L — ABNORMAL LOW (ref 98–111)
Creatinine, Ser: 1.48 mg/dL — ABNORMAL HIGH (ref 0.61–1.24)
GFR, Estimated: 48 mL/min — ABNORMAL LOW (ref >60)
Glucose, Bld: 297 mg/dL — ABNORMAL HIGH (ref 70–99)
Potassium: 4.2 mmol/L (ref 3.5–5.1)
Sodium: 138 mmol/L (ref 135–145)
Total Bilirubin: 1.4 mg/dL — ABNORMAL HIGH (ref 0.0–1.2)
Total Protein: 7.3 g/dL (ref 6.5–8.1)

## 2023-08-11 LAB — LACTIC ACID, PLASMA
Lactic Acid, Venous: 4.2 mmol/L (ref 0.5–1.9)
Lactic Acid, Venous: 4 mmol/L (ref 0.5–1.9)

## 2023-08-11 LAB — HEMOGLOBIN A1C
Hgb A1c MFr Bld: 7.3 % — ABNORMAL HIGH (ref 4.8–5.6)
Mean Plasma Glucose: 162.81 mg/dL

## 2023-08-11 LAB — LIPASE, BLOOD: Lipase: 25 U/L (ref 11–51)

## 2023-08-11 MED ORDER — IOHEXOL 300 MG/ML  SOLN
100.0000 mL | Freq: Once | INTRAMUSCULAR | Status: AC | PRN
  Administered 2023-08-11: 100 mL via INTRAVENOUS

## 2023-08-11 MED ORDER — INSULIN ASPART 100 UNIT/ML IJ SOLN
0.0000 [IU] | INTRAMUSCULAR | Status: DC
  Administered 2023-08-11 (×2): 3 [IU] via SUBCUTANEOUS
  Administered 2023-08-11 – 2023-08-12 (×3): 2 [IU] via SUBCUTANEOUS
  Administered 2023-08-12: 3 [IU] via SUBCUTANEOUS
  Filled 2023-08-11 (×7): qty 1

## 2023-08-11 MED ORDER — LACTATED RINGERS IV SOLN: INTRAVENOUS | Status: AC

## 2023-08-11 MED ORDER — LACTATED RINGERS IV SOLN: 150.0000 mL/h | INTRAVENOUS | Status: DC

## 2023-08-11 MED ORDER — SODIUM CHLORIDE 0.9 % IV BOLUS (SEPSIS)
1000.0000 mL | Freq: Once | INTRAVENOUS | Status: AC
  Administered 2023-08-11: 1000 mL via INTRAVENOUS

## 2023-08-11 MED ORDER — PIPERACILLIN-TAZOBACTAM 3.375 G IVPB 30 MIN
3.3750 g | Freq: Once | INTRAVENOUS | Status: AC
  Administered 2023-08-11: 3.375 g via INTRAVENOUS
  Filled 2023-08-11 (×2): qty 50

## 2023-08-11 MED ORDER — ONDANSETRON HCL 4 MG PO TABS: 4.0000 mg | ORAL_TABLET | Freq: Four times a day (QID) | ORAL | Status: DC | PRN

## 2023-08-11 MED ORDER — ACETAMINOPHEN 500 MG PO TABS
1000.0000 mg | ORAL_TABLET | Freq: Once | ORAL | Status: AC
  Administered 2023-08-11: 1000 mg via ORAL
  Filled 2023-08-11: qty 2

## 2023-08-11 MED ORDER — SODIUM CHLORIDE 0.9 % IV SOLN: 12.5000 mg | Freq: Four times a day (QID) | INTRAVENOUS | Status: DC | PRN

## 2023-08-11 MED ORDER — ONDANSETRON HCL 4 MG/2ML IJ SOLN: 4.0000 mg | Freq: Four times a day (QID) | INTRAMUSCULAR | Status: DC | PRN

## 2023-08-11 MED ORDER — LACTATED RINGERS IV BOLUS
2000.0000 mL | Freq: Once | INTRAVENOUS | Status: AC
  Administered 2023-08-11: 2000 mL via INTRAVENOUS

## 2023-08-11 NOTE — Assessment & Plan Note (Signed)
 BP stable Titrate home regimen

## 2023-08-11 NOTE — Progress Notes (Signed)
Secure chat [facility] inquiring about 2nd lactic "Delay in getting 2nd Lactic d/t additional IVF ordered and infusing."

## 2023-08-11 NOTE — Consult Note (Signed)
CODE SEPSIS - PHARMACY COMMUNICATION  **Broad Spectrum Antibiotics should be administered within 1 hour of Sepsis diagnosis**  Time Code Sepsis Called/Page Received: 0357  Antibiotics Ordered: zosyn  Time of 1st antibiotic administration: 0525  Additional action taken by pharmacy: messaged nurse regarding delay in IV abx administration, no response currently  If necessary, Name of Provider/Nurse Contacted: n/a    Bettey Costa ,PharmD Clinical Pharmacist  08/11/2023  4:12 AM

## 2023-08-11 NOTE — Progress Notes (Signed)
 Pt being followed by [facility] for Sepsis protocol.

## 2023-08-11 NOTE — Assessment & Plan Note (Signed)
WBC 20 on presentation in setting of sepsis  Noted predominant concern for viral gastroenteritis given symptomatic presentation  Clinically dry  Pancultured in setting of sepsis  Monitor WBC w/ treatment and hydration

## 2023-08-11 NOTE — Assessment & Plan Note (Addendum)
Diarrhea Meeting sepsis criteria w/ HR 115, WBC 20 Lactate 4.5  Noted acute onset of intractable nausea, vomiting and diarrhea  CT A&P stable apart from colonic changes concerning for diarrhea  Suspect norovirus given community outbreak  UA mildly indicative of infection though without any urinary sxs  S/p IV zosyn in ER- defer further abx for now  Will panculture  Check GI panel  LR MIVF  Monitor for now  Reassess as appropriate

## 2023-08-11 NOTE — ED Triage Notes (Signed)
Pt to ed from home via POV for flu like symptoms and N/V. Pt had the flu 2 weeks ago. Pt is caox4, in no acute distress.

## 2023-08-11 NOTE — ED Notes (Signed)
Encouraged pt to change out of his pants. Pt said they were now dry and he would stay in them until "they mold".. Pt was not oriented to year and situation or reason for coming. Family admits to patient being stubborn and having memory issues at times. MD notified

## 2023-08-11 NOTE — H&P (Addendum)
History and Physical    Patient: Derek [name] NWG:[id number] DOB: [birth year] DOA: 08/11/2023 DOS: the patient was seen and examined on 08/11/2023 PCP: Marisue Ivan, MD  Patient coming from: Home  Chief Complaint:  Chief Complaint  Patient presents with   flu like symptoms   HPI: Derek [name] is a 78 y.o. male with medical history significant of type 2 diabetes, HTN, HLD presenting w/ sepsis, vomiting and diarrhea, leukocytosis. Pt reports sudden onset of generalized abdominal pain, nausea and vomiting as well as diarrhea from last night.  Patient had numerous episodes of nonbilious/nonbloody emesis as well as diarrhea.  Positive mild malaise.  No reports of recent dietary changes.  Unclear of recent sick contacts.  Noted to have been COVID-positive roughly 2 weeks ago.  Otherwise normal post viral course up until yesterday.  No chest pain or shortness of breath.  No dysuria or increased urinary frequency.  No back pain.  Denies eating any raw or undercooked food.  No reported recent antibiotic use Presented to the ER with temp of 99, heart rate 100s, BP stable.  Satting well room air.  White count 20, hemoglobin 14.9, platelets 280, urinalysis leukocyte positive, lactate 4.2, COVID flu and RSV negative.  Creatinine 1.48 with GFR in the 40s, glucose 297.  CT abdomen pelvis with colonic wall thickening concerning for diarrhea.  Question bladder outlet obstruction. Started on sepsis protocol in ER.   Review of Systems: As mentioned in the history of present illness. All other systems reviewed and are negative. Past Medical History:  Diagnosis Date   Diabetes mellitus without complication (HCC)    Hypertension    Mixed hyperlipidemia    Past Surgical History:  Procedure Laterality Date   BACK SURGERY  02/04/2013   L1-L2   CATARACT EXTRACTION W/PHACO Right 01/14/2022   Procedure: CATARACT EXTRACTION PHACO AND INTRAOCULAR LENS PLACEMENT (IOC) RIGHT DIABETIC;  Surgeon: Estanislado Pandy, MD;  Location: [facility];  Service: Ophthalmology;  Laterality: Right;  27.29 2:44.3   CATARACT EXTRACTION W/PHACO Left 01/24/2022   Procedure: CATARACT EXTRACTION PHACO AND INTRAOCULAR LENS PLACEMENT (IOC) LEFT DIABETIC 19.04 01:44.7;  Surgeon: Estanislado Pandy, MD;  Location: [facility];  Service: Ophthalmology;  Laterality: Left;   IVC FILTER INSERTION N/A 09/18/2022   Procedure: IVC FILTER INSERTION;  Surgeon: Renford Dills, MD;  Location: [facility];  Service: Cardiovascular;  Laterality: N/A;   PULMONARY THROMBECTOMY Bilateral 09/18/2022   Procedure: PULMONARY THROMBECTOMY;  Surgeon: Renford Dills, MD;  Location: [facility];  Service: Cardiovascular;  Laterality: Bilateral;   Social History:  reports that he has quit smoking. His smoking use included cigarettes. He has never used smokeless tobacco. He reports that he does not currently use alcohol. He reports that he does not use drugs.  No Known Allergies  History reviewed. No pertinent family history.  Prior to Admission medications   Medication Sig Start Date End Date Taking? Authorizing Provider  APIXABAN Everlene Balls) VTE STARTER PACK (10MG  AND 5MG ) Take as directed on package: start with two-5mg  tablets twice daily for 7 days. On day 8, switch to one-5mg  tablet twice daily. 09/19/22   [name], MD  atorvastatin (LIPITOR) 40 MG tablet Take 20 mg by mouth every other day.    [provider]  brompheniramine-pseudoephedrine-DM 30-2-10 MG/5ML syrup Take 5 mLs by mouth 4 (four) times daily as needed. 07/22/23   Gladys Damme, PA-C  lisinopril-hydrochlorothiazide (ZESTORETIC) 20-25 MG tablet Take 1 tablet by mouth  daily. 10-12.5    [provider]    Physical Exam: Vitals:   08/11/23 0200 08/11/23 0203  BP:  121/74  Pulse:  (!) 115  Resp:  20  Temp:  99 F (37.2 C)  TempSrc:  Oral  SpO2:  95%  Weight: 108 kg   Height: 5\' 9"  (1.753 m)     Physical Exam Constitutional:      Appearance: He is obese.  HENT:     Head: Normocephalic and atraumatic.     Nose: Nose normal.  Eyes:     Pupils: Pupils are equal, round, and reactive to Mccroskey.  Cardiovascular:     Rate and Rhythm: Normal rate and regular rhythm.  Pulmonary:     Effort: Pulmonary effort is normal.  Abdominal:     General: Bowel sounds are normal.  Musculoskeletal:        General: Normal range of motion.  Skin:    General: Skin is dry.  Neurological:     General: No focal deficit present.  Psychiatric:        Mood and Affect: Mood normal.     Data Reviewed:  There are no new results to review at this time.  CT ABDOMEN PELVIS W CONTRAST Addendum: ADDENDUM REPORT: 08/11/2023 05:49   ADDENDUM:  As noted in the body of the report, small bowel is concentrated in  the right abdomen and pelvis with colon concentrated in the left  abdomen and pelvis, features compatible with intestinal malrotation.   Electronically Signed    By: Kennith Center M.D.    On: 08/11/2023 05:49 Narrative: CLINICAL DATA:  Flu like symptoms with abdominal pain. Nausea and vomiting.  EXAM: CT ABDOMEN AND PELVIS WITH CONTRAST  TECHNIQUE: Multidetector CT imaging of the abdomen and pelvis was performed using the standard protocol following bolus administration of intravenous contrast.  RADIATION DOSE REDUCTION: This exam was performed according to the departmental dose-optimization program which includes automated exposure control, adjustment of the mA and/or kV according to patient size and/or use of iterative reconstruction technique.  CONTRAST:  OMNIPAQUE IOHEXOL 300 MG/ML  SOLN  COMPARISON:  Chest CTA 11/15/2022  FINDINGS: Lower chest: Subpleural reticulation in the visualized lung bases suggests component of underlying chronic lung disease. No pleural effusion.  Hepatobiliary: The liver shows diffusely decreased attenuation suggesting fat deposition. 2.1  cm low-density lesion in the dome of the left liver is compatible with a cyst. No followup imaging is recommended. There is no evidence for gallstones, gallbladder wall thickening, or pericholecystic fluid. No intrahepatic or extrahepatic biliary dilation.  Pancreas: No focal mass lesion. No dilatation of the main duct. No intraparenchymal cyst. No peripancreatic edema.  Spleen: No splenomegaly. No suspicious focal mass lesion.  Adrenals/Urinary Tract: No adrenal nodule or mass. Right kidney unremarkable. 6.9 cm exophytic cyst lower pole left kidney. No followup imaging is recommended. No evidence for hydroureter. Subtle bladder wall irregularity noted with right-sided bladder wall diverticulum. Features suspicious for underlying component of bladder outlet obstruction.  Stomach/Bowel: Stomach is moderately distended with fluid. Duodenum does not cross the midline as expected. Small bowel loops are concentrated in the right abdomen. Cecal tip is in the left lower quadrant with concentration of colonic anatomy in the left abdomen. No small bowel wall thickening or perienteric edema. The terminal ileum is normal. The appendix is normal. Colon is diffusely fluid-filled but nondilated and without evidence for colonic wall thickening. Scattered diverticula evident without diverticulitis. Fluid is visible in the rectum, a finding  compatible with clinical diarrhea.  Vascular/Lymphatic: There is mild atherosclerotic calcification of the abdominal aorta without aneurysm. There is no gastrohepatic or hepatoduodenal ligament lymphadenopathy. No retroperitoneal or mesenteric lymphadenopathy. Portal vein and superior mesenteric vein are patent. Splenic vein is patent. Celiac axis, SMA, and IMA are opacified normally No pelvic sidewall lymphadenopathy.  Reproductive: Prostate gland is enlarged.  Other: No intraperitoneal free fluid.  Musculoskeletal: Small bilateral groin hernias contain  only fat. No worrisome lytic or sclerotic osseous abnormality. Degenerative disc disease noted in the mid and lower lumbar spine. Bilateral pars interarticularis defects noted at L5. 6 mm anterolisthesis of L5 on S1 evident.  IMPRESSION: 1. No small bowel dilatation. No small bowel wall thickening or perienteric edema. Diffusely fluid-filled colon without evidence for colonic wall thickening . Fluid is visible in the rectum, a finding compatible with clinical diarrhea. 2. Hepatic steatosis. 3. Subtle bladder wall irregularity with right-sided bladder wall diverticulum. Features suspicious for underlying component of bladder outlet obstruction. 4. Prostatomegaly. 5. 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 6. Small bilateral groin hernias contain only fat. 7.  Aortic Atherosclerosis (ICD10-I70.0).  Electronically Signed: By: Kennith Center M.D. On: 08/11/2023 05:17 DG Chest 2 View CLINICAL DATA:  Sepsis.  Flu like symptoms.  EXAM: CHEST - 2 VIEW  COMPARISON:  09/15/2022  FINDINGS: Elevation of the right diaphragm which is chronic. There is no edema, consolidation, effusion, or pneumothorax. Borderline heart size which is accentuated by mediastinal fat compared to most recent CT. Negative aortic and hilar contours.  IMPRESSION: No evidence of active disease.  Stable from prior.  Electronically Signed   By: Tiburcio Pea M.D.   On: 08/11/2023 04:28  Lab Results  Component Value Date   WBC 20.0 (H) 08/11/2023   HGB 14.9 08/11/2023   HCT 44.0 08/11/2023   MCV 90.7 08/11/2023   PLT 280 08/11/2023   Last metabolic panel Lab Results  Component Value Date   GLUCOSE 297 (H) 08/11/2023   NA 138 08/11/2023   K 4.2 08/11/2023   CL 97 (L) 08/11/2023   CO2 20 (L) 08/11/2023   BUN 21 08/11/2023   CREATININE 1.48 (H) 08/11/2023   GFRNONAA 48 (L) 08/11/2023   CALCIUM 9.3 08/11/2023   PROT 7.3 08/11/2023   ALBUMIN 4.2  08/11/2023   BILITOT 1.4 (H) 08/11/2023   ALKPHOS 62 08/11/2023   AST 33 08/11/2023   ALT 20 08/11/2023   ANIONGAP 21 (H) 08/11/2023    Assessment and Plan: * Sepsis (HCC) Diarrhea Meeting sepsis criteria w/ HR 115, WBC 20 Lactate 4.5  Noted acute onset of intractable nausea, vomiting and diarrhea  CT A&P stable apart from colonic changes concerning for diarrhea  Suspect norovirus given community outbreak  UA mildly indicative of infection though without any urinary sxs  S/p IV zosyn in ER- defer further abx for now  Will panculture  Check GI panel  LR MIVF  Monitor for now  Reassess as appropriate   Leukocytosis WBC 20 on presentation in setting of sepsis  Noted predominant concern for viral gastroenteritis given symptomatic presentation  Clinically dry  Pancultured in setting of sepsis  Monitor WBC w/ treatment and hydration   Pulmonary embolism (HCC) On eliquis    Type 2 diabetes mellitus with hyperlipidemia (HCC) Blood sugar 290s  SSI Monitor   Mixed hyperlipidemia Cont statin    Essential hypertension BP stable  Titrate home regimen        Advance Care Planning:  Code Status: Full Code   Consults: None   Family Communication: Family at the bedside   Severity of Illness: The appropriate patient status for this patient is OBSERVATION. Observation status is judged to be reasonable and necessary in order to provide the required intensity of service to ensure the patient's safety. The patient's presenting symptoms, physical exam findings, and initial radiographic and laboratory data in the context of their medical condition is felt to place them at decreased risk for further clinical deterioration. Furthermore, it is anticipated that the patient will be medically stable for discharge from the hospital within 2 midnights of admission.   Author: Floydene Flock, MD 08/11/2023 7:55 AM  For on call review [email or web address].

## 2023-08-11 NOTE — Assessment & Plan Note (Signed)
Blood sugar 290s  SSI Monitor

## 2023-08-11 NOTE — ED Provider Notes (Signed)
[facility] Provider Note    Event Date/Time   First MD Initiated Contact with Patient 08/11/23 [phone]     (approximate)   History   flu like symptoms   HPI  Derek [name] is a 78 y.o. male with history of hypertension, diabetes, hyperlipidemia, prior PE on Eliquis who presents to the emergency department with generalized abdominal pain, nausea, vomiting and diarrhea that started today.  Also has had a cough and some dysuria.  Reports he had influenza about 2 to 3 weeks ago.  States he finished a course of Tamiflu.  No chest pain or shortness of breath.  Denies any prior abdominal surgeries.   History provided by patient, family.    Past Medical History:  Diagnosis Date   Diabetes mellitus without complication (HCC)    Hypertension    Mixed hyperlipidemia     Past Surgical History:  Procedure Laterality Date   BACK SURGERY  02/04/2013   L1-L2   CATARACT EXTRACTION W/PHACO Right 01/14/2022   Procedure: CATARACT EXTRACTION PHACO AND INTRAOCULAR LENS PLACEMENT (IOC) RIGHT DIABETIC;  Surgeon: Estanislado Pandy, MD;  Location: [facility];  Service: Ophthalmology;  Laterality: Right;  27.29 2:44.3   CATARACT EXTRACTION W/PHACO Left 01/24/2022   Procedure: CATARACT EXTRACTION PHACO AND INTRAOCULAR LENS PLACEMENT (IOC) LEFT DIABETIC 19.04 01:44.7;  Surgeon: Estanislado Pandy, MD;  Location: [facility];  Service: Ophthalmology;  Laterality: Left;   IVC FILTER INSERTION N/A 09/18/2022   Procedure: IVC FILTER INSERTION;  Surgeon: Renford Dills, MD;  Location: [facility] INVASIVE CV LAB;  Service: Cardiovascular;  Laterality: N/A;   PULMONARY THROMBECTOMY Bilateral 09/18/2022   Procedure: PULMONARY THROMBECTOMY;  Surgeon: Renford Dills, MD;  Location: [facility] INVASIVE CV LAB;  Service: Cardiovascular;  Laterality: Bilateral;    MEDICATIONS:  Prior to Admission medications   Medication Sig Start Date End Date Taking? Authorizing Provider   APIXABAN Everlene Balls) VTE STARTER PACK (10MG  AND 5MG ) Take as directed on package: start with two-5mg  tablets twice daily for 7 days. On day 8, switch to one-5mg  tablet twice daily. 09/19/22   [name], MD  atorvastatin (LIPITOR) 40 MG tablet Take 20 mg by mouth every other day.    [provider]  brompheniramine-pseudoephedrine-DM 30-2-10 MG/5ML syrup Take 5 mLs by mouth 4 (four) times daily as needed. 07/22/23   Gladys Damme, PA-C  lisinopril-hydrochlorothiazide (ZESTORETIC) 20-25 MG tablet Take 1 tablet by mouth daily. 10-12.5    [provider]    Physical Exam   Triage Vital Signs: ED Triage Vitals  Encounter Vitals Group     BP 08/11/23 0203 121/74     Systolic BP Percentile --      Diastolic BP Percentile --      Pulse Rate 08/11/23 0203 (!) 115     Resp 08/11/23 0203 20     Temp 08/11/23 0203 99 F (37.2 C)     Temp Source 08/11/23 0203 Oral     SpO2 08/11/23 0203 95 %     Weight 08/11/23 0200 238 lb 1.6 oz (108 kg)     Height 08/11/23 0200 5\' 9"  (1.753 m)     Head Circumference --      Peak Flow --      Pain Score 08/11/23 0200 0     Pain Loc --      Pain Education --      Exclude from Growth Chart --     Most recent vital signs: Vitals:  08/11/23 0203  BP: 121/74  Pulse: (!) 115  Resp: 20  Temp: 99 F (37.2 C)  SpO2: 95%    CONSTITUTIONAL: Alert, responds appropriately to questions.  Elderly, joking, laughing, in no distress HEAD: Normocephalic, atraumatic EYES: Conjunctivae clear, pupils appear equal, sclera nonicteric ENT: normal nose; moist mucous membranes NECK: Supple, normal ROM CARD: Regular and tachycardic; S1 and S2 appreciated RESP: Normal chest excursion without splinting or tachypnea; breath sounds clear and equal bilaterally; no wheezes, no rhonchi, no rales, no hypoxia or respiratory distress, speaking full sentences ABD/GI: Non-distended; soft, mild tenderness to palpation diffusely without guarding or  rebound BACK: The back appears normal EXT: Normal ROM in all joints; no deformity noted, no edema SKIN: Normal color for age and race; warm; no rash on exposed skin NEURO: Moves all extremities equally, normal speech PSYCH: The patient's mood and manner are appropriate.   ED Results / Procedures / Treatments   LABS: (all labs ordered are listed, but only abnormal results are displayed) Labs Reviewed  COMPREHENSIVE METABOLIC PANEL - Abnormal; Notable for the following components:      Result Value   Chloride 97 (*)    CO2 20 (*)    Glucose, Bld 297 (*)    Creatinine, Ser 1.48 (*)    Total Bilirubin 1.4 (*)    GFR, Estimated 48 (*)    Anion gap 21 (*)    All other components within normal limits  CBC - Abnormal; Notable for the following components:   WBC 20.0 (*)    All other components within normal limits  URINALYSIS, ROUTINE W REFLEX MICROSCOPIC - Abnormal; Notable for the following components:   Color, Urine YELLOW (*)    APPearance HAZY (*)    Specific Gravity, Urine >1.046 (*)    Glucose, UA 150 (*)    Ketones, ur 5 (*)    Leukocytes,Ua MODERATE (*)    All other components within normal limits  LACTIC ACID, PLASMA - Abnormal; Notable for the following components:   Lactic Acid, Venous 4.2 (*)    All other components within normal limits  RESP PANEL BY RT-PCR (RSV, FLU A&B, COVID)  RVPGX2  CULTURE, BLOOD (ROUTINE X 2)  CULTURE, BLOOD (ROUTINE X 2)  URINE CULTURE  LIPASE, BLOOD  LACTIC ACID, PLASMA  HEMOGLOBIN A1C     EKG:  EKG Interpretation Date/Time:  Monday August 11 2023 02:08:52 EST Ventricular Rate:  118 PR Interval:  170 QRS Duration:  78 QT Interval:  316 QTC Calculation: 442 R Axis:   2  Text Interpretation: Sinus tachycardia Inferior infarct , age undetermined Possible Anterolateral infarct , age undetermined Abnormal ECG When compared with ECG of 15-Sep-2022 19:30, PREVIOUS ECG IS PRESENT Confirmed by Rochele Raring [phone]) on 08/11/2023 3:55:30  AM         RADIOLOGY: My personal review and interpretation of imaging: CT scan shows no acute abnormality.  I have personally reviewed all radiology reports.   CT ABDOMEN PELVIS W CONTRAST Addendum Date: 08/11/2023 ADDENDUM REPORT: 08/11/2023 05:49 ADDENDUM: As noted in the body of the report, small bowel is concentrated in the right abdomen and pelvis with colon concentrated in the left abdomen and pelvis, features compatible with intestinal malrotation. Electronically Signed   By: Kennith Center M.D.   On: 08/11/2023 05:49   Result Date: 08/11/2023 CLINICAL DATA:  Flu like symptoms with abdominal pain. Nausea and vomiting. EXAM: CT ABDOMEN AND PELVIS WITH CONTRAST TECHNIQUE: Multidetector CT imaging of the abdomen and pelvis was  performed using the standard protocol following bolus administration of intravenous contrast. RADIATION DOSE REDUCTION: This exam was performed according to the departmental dose-optimization program which includes automated exposure control, adjustment of the mA and/or kV according to patient size and/or use of iterative reconstruction technique. CONTRAST:  OMNIPAQUE IOHEXOL 300 MG/ML  SOLN COMPARISON:  Chest CTA 11/15/2022 FINDINGS: Lower chest: Subpleural reticulation in the visualized lung bases suggests component of underlying chronic lung disease. No pleural effusion. Hepatobiliary: The liver shows diffusely decreased attenuation suggesting fat deposition. 2.1 cm low-density lesion in the dome of the left liver is compatible with a cyst. No followup imaging is recommended. There is no evidence for gallstones, gallbladder wall thickening, or pericholecystic fluid. No intrahepatic or extrahepatic biliary dilation. Pancreas: No focal mass lesion. No dilatation of the main duct. No intraparenchymal cyst. No peripancreatic edema. Spleen: No splenomegaly. No suspicious focal mass lesion. Adrenals/Urinary Tract: No adrenal nodule or mass. Right kidney unremarkable. 6.9  cm exophytic cyst lower pole left kidney. No followup imaging is recommended. No evidence for hydroureter. Subtle bladder wall irregularity noted with right-sided bladder wall diverticulum. Features suspicious for underlying component of bladder outlet obstruction. Stomach/Bowel: Stomach is moderately distended with fluid. Duodenum does not cross the midline as expected. Small bowel loops are concentrated in the right abdomen. Cecal tip is in the left lower quadrant with concentration of colonic anatomy in the left abdomen. No small bowel wall thickening or perienteric edema. The terminal ileum is normal. The appendix is normal. Colon is diffusely fluid-filled but nondilated and without evidence for colonic wall thickening. Scattered diverticula evident without diverticulitis. Fluid is visible in the rectum, a finding compatible with clinical diarrhea. Vascular/Lymphatic: There is mild atherosclerotic calcification of the abdominal aorta without aneurysm. There is no gastrohepatic or hepatoduodenal ligament lymphadenopathy. No retroperitoneal or mesenteric lymphadenopathy. Portal vein and superior mesenteric vein are patent. Splenic vein is patent. Celiac axis, SMA, and IMA are opacified normally No pelvic sidewall lymphadenopathy. Reproductive: Prostate gland is enlarged. Other: No intraperitoneal free fluid. Musculoskeletal: Small bilateral groin hernias contain only fat. No worrisome lytic or sclerotic osseous abnormality. Degenerative disc disease noted in the mid and lower lumbar spine. Bilateral pars interarticularis defects noted at L5. 6 mm anterolisthesis of L5 on S1 evident. IMPRESSION: 1. No small bowel dilatation. No small bowel wall thickening or perienteric edema. Diffusely fluid-filled colon without evidence for colonic wall thickening . Fluid is visible in the rectum, a finding compatible with clinical diarrhea. 2. Hepatic steatosis. 3. Subtle bladder wall irregularity with right-sided bladder wall  diverticulum. Features suspicious for underlying component of bladder outlet obstruction. 4. Prostatomegaly. 5. 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 6. Small bilateral groin hernias contain only fat. 7.  Aortic Atherosclerosis (ICD10-I70.0). Electronically Signed: By: Kennith Center M.D. On: 08/11/2023 05:17   DG Chest 2 View Result Date: 08/11/2023 CLINICAL DATA:  Sepsis.  Flu like symptoms. EXAM: CHEST - 2 VIEW COMPARISON:  09/15/2022 FINDINGS: Elevation of the right diaphragm which is chronic. There is no edema, consolidation, effusion, or pneumothorax. Borderline heart size which is accentuated by mediastinal fat compared to most recent CT. Negative aortic and hilar contours. IMPRESSION: No evidence of active disease.  Stable from prior. Electronically Signed   By: Tiburcio Pea M.D.   On: 08/11/2023 04:28     PROCEDURES:  Critical Care performed: Yes, see critical care procedure note(s)   CRITICAL CARE Performed by: Baxter Hire Chrishelle Zito   Total critical care  time: 40 minutes  Critical care time was exclusive of separately billable procedures and treating other patients.  Critical care was necessary to treat or prevent imminent or life-threatening deterioration.  Critical care was time spent personally by me on the following activities: development of treatment plan with patient and/or surrogate as well as nursing, discussions with consultants, evaluation of patient's response to treatment, examination of patient, obtaining history from patient or surrogate, ordering and performing treatments and interventions, ordering and review of laboratory studies, ordering and review of radiographic studies, pulse oximetry and re-evaluation of patient's condition.   Marland Kitchen1-3 Lead EKG Interpretation  Performed by: Prisma Decarlo, Layla Maw, DO Authorized by: Kamaria Lucia, Layla Maw, DO       IMPRESSION / MDM / ASSESSMENT AND PLAN / ED COURSE  I reviewed the triage vital  signs and the nursing notes.    Patient here for vomiting, diarrhea, cough.  Low-grade temperature here, tachycardia.  The patient is on the cardiac monitor to evaluate for evidence of arrhythmia and/or significant heart rate changes.   DIFFERENTIAL DIAGNOSIS (includes but not limited to):   Sepsis, UTI, viral gastroenteritis, dehydration, colitis, diverticulitis, appendicitis, cholecystitis, pancreatitis, pneumonia, viral illness   Patient's presentation is most consistent with acute presentation with potential threat to life or bodily function.   PLAN: Patient's labs show a leukocytosis of 20,000.  Will initiate septic workup with IV fluids, Zosyn.  Will obtain cultures, lactic.  Creatinine elevated at 1.48 likely from GI loss.  COVID, flu and RSV swabs pending.  Will obtain chest x-ray, CT of the abdomen pelvis.   MEDICATIONS GIVEN IN ED: Medications  promethazine (PHENERGAN) 12.5 mg in sodium chloride 0.9 % 50 mL IVPB (has no administration in time range)  insulin aspart (novoLOG) injection 0-15 Units (has no administration in time range)  lactated ringers infusion (has no administration in time range)  sodium chloride 0.9 % bolus 1,000 mL (1,000 mLs Intravenous New Bag/Given 08/11/23 0404)  acetaminophen (TYLENOL) tablet 1,000 mg (1,000 mg Oral Given 08/11/23 0530)  piperacillin-tazobactam (ZOSYN) IVPB 3.375 g (0 g Intravenous Stopped 08/11/23 0657)  iohexol (OMNIPAQUE) 300 MG/ML solution 100 mL (100 mLs Intravenous Contrast Given 08/11/23 0433)  lactated ringers bolus 2,000 mL (2,000 mLs Intravenous New Bag/Given 08/11/23 0531)     ED COURSE: Lactic 4.5.  Will give an additional 2 L of IV fluids for 30 mL/kg IV fluid bolus of his ideal body weight.  He remains normotensive.   CT of the abdomen pelvis reviewed and interpreted by myself and the radiologist and shows no acute abnormality other than fluid in the rectum consistent with diarrhea.  Urine does appear infected.  Will send  urine culture.  Will discuss with hospitalist for admission.  CONSULTS:  Consulted and discussed patient's case with hospitalist, Dr. Para March.  I have recommended admission and consulting physician agrees and will place admission orders.  Patient (and family if present) agree with this plan.   I reviewed all nursing notes, vitals, pertinent previous records.  All labs, EKGs, imaging ordered have been independently reviewed and interpreted by myself.    OUTSIDE RECORDS REVIEWED: Reviewed last PCP note on 05/30/2023.       FINAL CLINICAL IMPRESSION(S) / ED DIAGNOSES   Final diagnoses:  Nausea vomiting and diarrhea  Acute sepsis (HCC)  Acute UTI     Rx / DC Orders   ED Discharge Orders     None        Note:  This document was prepared using Dragon  voice recognition software and may include unintentional dictation errors.   Ryane Konieczny, Layla Maw, DO 08/11/23 [phone]

## 2023-08-11 NOTE — Assessment & Plan Note (Signed)
 Cont statin

## 2023-08-11 NOTE — ED Notes (Signed)
Pt has had been incontinent of urine in his jeans but refuses to change into purple pants at this time. Explained the benefits of changing out and provided pants. Pt refuses. Will attempt again. Upon starting new IV due to IV beeping, pt made aggressive comments such as "you're in trouble with me" and also when RN stated "there is about to be a pinch here..." he responded with a comment regarding his fist. Presumably a joke as patient began making other small talk exchanges but noteworthy.

## 2023-08-11 NOTE — Assessment & Plan Note (Signed)
 On eliquis

## 2023-08-12 DIAGNOSIS — R109 Unspecified abdominal pain: Secondary | ICD-10-CM | POA: Diagnosis not present

## 2023-08-12 DIAGNOSIS — R112 Nausea with vomiting, unspecified: Secondary | ICD-10-CM

## 2023-08-12 DIAGNOSIS — Q433 Congenital malformations of intestinal fixation: Secondary | ICD-10-CM

## 2023-08-12 DIAGNOSIS — K529 Noninfective gastroenteritis and colitis, unspecified: Secondary | ICD-10-CM | POA: Diagnosis present

## 2023-08-12 DIAGNOSIS — A419 Sepsis, unspecified organism: Secondary | ICD-10-CM | POA: Diagnosis not present

## 2023-08-12 DIAGNOSIS — R197 Diarrhea, unspecified: Secondary | ICD-10-CM | POA: Diagnosis not present

## 2023-08-12 LAB — CBG MONITORING, ED
Glucose-Capillary: 132 mg/dL — ABNORMAL HIGH (ref 70–99)
Glucose-Capillary: 150 mg/dL — ABNORMAL HIGH (ref 70–99)
Glucose-Capillary: 173 mg/dL — ABNORMAL HIGH (ref 70–99)

## 2023-08-12 LAB — COMPREHENSIVE METABOLIC PANEL
ALT: 15 U/L (ref 0–44)
AST: 22 U/L (ref 15–41)
Albumin: 3.2 g/dL — ABNORMAL LOW (ref 3.5–5.0)
Alkaline Phosphatase: 36 U/L — ABNORMAL LOW (ref 38–126)
Anion gap: 11 (ref 5–15)
BUN: 18 mg/dL (ref 8–23)
CO2: 25 mmol/L (ref 22–32)
Calcium: 8.1 mg/dL — ABNORMAL LOW (ref 8.9–10.3)
Chloride: 100 mmol/L (ref 98–111)
Creatinine, Ser: 1.17 mg/dL (ref 0.61–1.24)
GFR, Estimated: >60 mL/min (ref >60)
Glucose, Bld: 205 mg/dL — ABNORMAL HIGH (ref 70–99)
Potassium: 3.5 mmol/L (ref 3.5–5.1)
Sodium: 136 mmol/L (ref 135–145)
Total Bilirubin: 1.3 mg/dL — ABNORMAL HIGH (ref 0.0–1.2)
Total Protein: 5.8 g/dL — ABNORMAL LOW (ref 6.5–8.1)

## 2023-08-12 LAB — CBC
HCT: 33.5 % — ABNORMAL LOW (ref 39.0–52.0)
Hemoglobin: 11.5 g/dL — ABNORMAL LOW (ref 13.0–17.0)
MCH: 31.1 pg (ref 26.0–34.0)
MCHC: 34.3 g/dL (ref 30.0–36.0)
MCV: 90.5 fL (ref 80.0–100.0)
Platelets: 184 10*3/uL (ref 150–400)
RBC: 3.7 MIL/uL — ABNORMAL LOW (ref 4.22–5.81)
RDW: 14.5 % (ref 11.5–15.5)
WBC: 9.3 10*3/uL (ref 4.0–10.5)
nRBC: 0 % (ref 0.0–0.2)

## 2023-08-12 LAB — GASTROINTESTINAL PANEL BY PCR, STOOL (REPLACES STOOL CULTURE)
Adenovirus F40/41: NOT DETECTED
Astrovirus: NOT DETECTED
Campylobacter species: NOT DETECTED
Cryptosporidium: NOT DETECTED
Cyclospora cayetanensis: NOT DETECTED
Entamoeba histolytica: NOT DETECTED
Enteroaggregative E coli (EAEC): NOT DETECTED
Enteropathogenic E coli (EPEC): NOT DETECTED
Enterotoxigenic E coli (ETEC): NOT DETECTED
Giardia lamblia: DETECTED — AB
Norovirus GI/GII: DETECTED — AB
Plesimonas shigelloides: NOT DETECTED
Rotavirus A: NOT DETECTED
Salmonella species: NOT DETECTED
Sapovirus (I, II, IV, and V): NOT DETECTED
Shiga like toxin producing E coli (STEC): NOT DETECTED
Shigella/Enteroinvasive E coli (EIEC): NOT DETECTED
Vibrio cholerae: NOT DETECTED
Vibrio species: NOT DETECTED
Yersinia enterocolitica: NOT DETECTED

## 2023-08-12 LAB — LACTIC ACID, PLASMA: Lactic Acid, Venous: 2 mmol/L (ref 0.5–1.9)

## 2023-08-12 LAB — C DIFFICILE QUICK SCREEN W PCR REFLEX
C Diff antigen: NEGATIVE
C Diff interpretation: NOT DETECTED
C Diff toxin: NEGATIVE

## 2023-08-12 LAB — MAGNESIUM: Magnesium: 1.9 mg/dL (ref 1.7–2.4)

## 2023-08-12 MED ORDER — METRONIDAZOLE 250 MG PO TABS: 250.0000 mg | ORAL_TABLET | Freq: Three times a day (TID) | ORAL | 0 refills | Status: AC

## 2023-08-12 MED ORDER — ACETAMINOPHEN 325 MG PO TABS
650.0000 mg | ORAL_TABLET | Freq: Four times a day (QID) | ORAL | Status: DC | PRN
  Administered 2023-08-12: 650 mg via ORAL
  Filled 2023-08-12: qty 2

## 2023-08-12 NOTE — Care Management Obs Status (Signed)
MEDICARE OBSERVATION STATUS NOTIFICATION   Patient Details  Name: Derek [name] MRN: [id number] Date of Birth: [birth year]   Medicare Observation Status Notification Given:  Yes    Erin Sons, LCSW 08/12/2023, 2:22 PM

## 2023-08-12 NOTE — ED Notes (Addendum)
Called lab to expediate lab work per central capacity center

## 2023-08-12 NOTE — Consult Note (Signed)
[facility] SURGICAL CONSULTATION NOTE (initial) - cpt: 16109   HISTORY OF PRESENT ILLNESS (HPI):  78 y.o. Derek [name] presented to [facility] ED yesterday secondary to abdominal pain, nausea, emesis, and diarrhea over the last 3 days. Abdominal pain was generalized. No fever, chills, CP, urinary changes. Did report cough and was treated for influenza about 2-3 weeks ago. Denied any sick contacts, recent travel, or abnormal food exposures. Work up in the ED revealed a WBC to 20.0K (now 9.0K), Hgb to 14.9, AKI with sCr - 1.48, initial venous lactate 4.2 (now 2.0), UCx with E coli, stool studies are still pending. He did have a CT Abdomen/Pelvis which was concerning for fluid filled colon concerning for diarrhea. 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 He was admitted to the medicine service.   This morning, he reports he feels a lot better. Abdominal pain is resolved. No longer with emesis. He is on regular diet; tolerating.   Surgery is consulted by hospitalist physician Dr. 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  PAST MEDICAL HISTORY (PMH):  Past Medical History:  Diagnosis Date   Diabetes mellitus without complication (HCC)    Hypertension    Mixed hyperlipidemia      PAST SURGICAL HISTORY (PSH):  Past Surgical History:  Procedure Laterality Date   BACK SURGERY  02/04/2013   L1-L2   CATARACT EXTRACTION W/PHACO Right 01/14/2022   Procedure: CATARACT EXTRACTION PHACO AND INTRAOCULAR LENS PLACEMENT (IOC) RIGHT DIABETIC;  Surgeon: Estanislado Pandy, MD;  Location: [facility];  Service: Ophthalmology;  Laterality: Right;  27.29 2:44.3   CATARACT EXTRACTION W/PHACO Left 01/24/2022   Procedure: CATARACT EXTRACTION PHACO AND INTRAOCULAR LENS PLACEMENT (IOC) LEFT DIABETIC 19.04 01:44.7;  Surgeon:  Estanislado Pandy, MD;  Location: [facility];  Service: Ophthalmology;  Laterality: Left;   IVC FILTER INSERTION N/A 09/18/2022   Procedure: IVC FILTER INSERTION;  Surgeon: Renford Dills, MD;  Location: [facility];  Service: Cardiovascular;  Laterality: N/A;   PULMONARY THROMBECTOMY Bilateral 09/18/2022   Procedure: PULMONARY THROMBECTOMY;  Surgeon: Renford Dills, MD;  Location: [facility];  Service: Cardiovascular;  Laterality: Bilateral;     MEDICATIONS:  Prior to Admission medications   Medication Sig Start Date End Date Taking? Authorizing Provider  brompheniramine-pseudoephedrine-DM 30-2-10 MG/5ML syrup Take 5 mLs by mouth 4 (four) times daily as needed. 07/22/23  Yes Evans, Alexandra, PA-C  ELIQUIS 5 MG TABS tablet Take 5 mg by mouth 2 (two) times daily.   Yes [provider]  glipiZIDE (GLUCOTROL XL) 5 MG 24 hr tablet Take 5 mg by mouth daily with breakfast. 06/27/23  Yes [provider]  lisinopril-hydrochlorothiazide (ZESTORETIC) 10-12.5 MG tablet Take 1 tablet by mouth daily. 06/20/23  Yes [provider]  traZODone (DESYREL) 100 MG tablet Take 1 tablet by mouth at bedtime. 05/30/23  Yes [provider]  APIXABAN Everlene Balls) VTE STARTER PACK (10MG  AND 5MG ) Take as directed on package: start with two-5mg  tablets twice daily for 7 days. On day 8, switch to one-5mg  tablet twice daily. Patient not taking: Reported on 08/11/2023 09/19/22   [name], MD  atorvastatin (LIPITOR) 40 MG tablet Take 20 mg by mouth every other day.    [provider]  escitalopram (LEXAPRO) 5 MG tablet Take 5 mg by mouth daily. Patient  not taking: Reported on 08/11/2023 05/30/23   [provider]  lisinopril-hydrochlorothiazide (ZESTORETIC) 20-25 MG tablet Take 1 tablet by mouth daily. 10-12.5 Patient not taking: Reported on 08/11/2023    [provider]     ALLERGIES:  No Known Allergies   SOCIAL  HISTORY:  Social History   Socioeconomic History   Marital status: Married    Spouse name: Not on file   Number of children: Not on file   Years of education: Not on file   Highest education level: Not on file  Occupational History   Not on file  Tobacco Use   Smoking status: Former    Types: Cigarettes   Smokeless tobacco: Never   Tobacco comments:    "Quit when they got to 50 cents/pack"  Vaping Use   Vaping status: Never Used  Substance and Sexual Activity   Alcohol use: Not Currently   Drug use: Never   Sexual activity: Not on file  Other Topics Concern   Not on file  Social History Narrative   Not on file   Social Drivers of Health   Financial Resource Strain: Low Risk  (05/30/2023)   Received from [facility]   Overall Financial Resource Strain (CARDIA)    Difficulty of Paying Living Expenses: Not hard at all  Food Insecurity: No Food Insecurity (05/30/2023)   [facility] from [facility]   Hunger Vital Sign    Worried About Running Out of Food in the Last Year: Never true    Ran Out of Food in the Last Year: Never true  Transportation Needs: No Transportation Needs (05/30/2023)   Received from [facility] - Transportation    In the past 12 months, has lack of transportation kept you from medical appointments or from getting medications?: No    Lack of Transportation (Non-Medical): No  Physical Activity: Not on file  Stress: Not on file  Social Connections: Not on file  Intimate Partner Violence: Not At Risk (09/16/2022)   Humiliation, Afraid, Rape, and Kick questionnaire    Fear of Current or Ex-Partner: No    Emotionally Abused: No    Physically Abused: No    Sexually Abused: No     FAMILY HISTORY:  History reviewed. No pertinent family history.    REVIEW OF SYSTEMS:  Review of Systems  Constitutional:  Negative for chills and fever.  Respiratory:  Negative for cough and shortness of breath.    Cardiovascular:  Negative for chest pain and palpitations.  Gastrointestinal:  Positive for abdominal pain, diarrhea, nausea and vomiting.  Genitourinary:  Negative for dysuria and urgency.  All other systems reviewed and are negative.   VITAL SIGNS:  Temp:  [98.5 F (36.9 C)-100.4 F (38 C)] 100.2 F (37.9 C) (02/18 0828) Pulse Rate:  [74-103] 83 (02/18 0600) Resp:  [18-22] 19 (02/18 0600) BP: (107-146)/(61-75) 136/69 (02/18 0600) SpO2:  [95 %-97 %] 96 % (02/18 0600)     Height: 5\' 9"  (175.3 cm) Weight: 108 kg BMI (Calculated): 35.14   INTAKE/OUTPUT:  02/17 0701 - 02/18 0700 In: 1000 [IV Piggyback:1000] Out: 400 [Urine:400]  PHYSICAL EXAM:  Physical Exam Vitals and nursing note reviewed. Exam conducted with a chaperone present.  Constitutional:      Appearance: Normal appearance. He is obese. He is not ill-appearing.     Comments: Resting in bed; NAD  HENT:     Head: Normocephalic and atraumatic.  Eyes:  General: No scleral icterus.    Conjunctiva/sclera: Conjunctivae normal.  Pulmonary:     Effort: Pulmonary effort is normal. No respiratory distress.  Abdominal:     General: Abdomen is protuberant. There is no distension.     Palpations: Abdomen is soft.     Tenderness: There is no abdominal tenderness. There is no guarding or rebound.     Comments: Abdomen is soft, non-tender, non-distended. No rebound/guarding   Genitourinary:    Comments: Deferred Musculoskeletal:     Right lower leg: No edema.     Left lower leg: No edema.  Skin:    General: Skin is warm and dry.     Coloration: Skin is not pale.     Findings: No erythema.  Neurological:     General: No focal deficit present.     Mental Status: He is alert and oriented to person, place, and time.  Psychiatric:        Mood and Affect: Mood normal.        Behavior: Behavior normal.      Labs:     Latest Ref Rng & Units 08/11/2023    2:04 AM 09/19/2022    7:31 AM 09/18/2022    5:16 AM  CBC  WBC  4.0 - 10.5 K/uL 20.0  8.9  7.6   Hemoglobin 13.0 - 17.0 g/dL 84.6  96.2  95.2   Hematocrit 39.0 - 52.0 % 44.0  38.7  37.2   Platelets 150 - 400 K/uL 280  259  257       Latest Ref Rng & Units 08/11/2023    2:04 AM 09/18/2022    5:16 AM 09/16/2022    5:14 AM  CMP  Glucose 70 - 99 mg/dL 841  324  401   BUN 8 - 23 mg/dL 21  13  15    Creatinine 0.61 - 1.24 mg/dL 0.27  2.53  6.64   Sodium 135 - 145 mmol/L 138  141  139   Potassium 3.5 - 5.1 mmol/L 4.2  3.6  3.6   Chloride 98 - 111 mmol/L 97  102  101   CO2 22 - 32 mmol/L 20  25  25    Calcium 8.9 - 10.3 mg/dL 9.3  8.8  9.0   Total Protein 6.5 - 8.1 g/dL 7.3   6.6   Total Bilirubin 0.0 - 1.2 mg/dL 1.4   0.9   Alkaline Phos 38 - 126 U/L 62   56   AST 15 - 41 U/L 33   20   ALT 0 - 44 U/L 20   15      Imaging studies:   CT Abdomen/Pelvis (08/11/2023) personally reviewed with noted changes consistent with congenital malrotation, no evidence of obstruction, volvulus, or ischemia, there is fluid filled colon consistent with known diarrheal illness, and radiologist report reviewed below:  IMPRESSION: 1. No small bowel dilatation. No small bowel wall thickening or perienteric edema. Diffusely fluid-filled colon without evidence for colonic wall thickening . Fluid is visible in the rectum, a finding compatible with clinical diarrhea. 2. Hepatic steatosis. 3. Subtle bladder wall irregularity with right-sided bladder wall diverticulum. Features suspicious for underlying component of bladder outlet obstruction. 4. Prostatomegaly. 5. 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 6. Small bilateral groin hernias contain only fat. 7.  Aortic Atherosclerosis (ICD10-I70.0).   Assessment/Plan: 78 y.o. Derek [name] with abdominal pain, nausea, emesis, and diarrhea consistent with likely gastroenteritis incidentally found to have congential  malrotation.   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 Patient unaware of this prior to now. Fortunately, there is no evidence of obstruction, volvulus, or bowel compromise. There is no indication for surgical intervention - Agree, this is most likely consistent with gastroenteritis and clinically appears improved - Okay for diet as tolerated - Monitor abdominal examination - Monitor leukocytosis; resolved - Monitor lactic acidosis; improved with IV hydration - Monitor AKI; pending  - Mobilize   - Further management per primary service    All of the above findings and recommendations were discussed with the patient and his daughter at bedside, and all of their questions were answered to their expressed satisfaction.  Thank you for the opportunity to participate in this patient's care.   -- Lynden Oxford, PA-C [facility] 08/12/2023, 9:19 AM M-F: 7am - 4pm

## 2023-08-12 NOTE — Discharge Summary (Signed)
Physician Discharge Summary   Derek [name]  male DOB: 11-Aug-1945  BJY:[id number]  PCP: Marisue Ivan, MD  Admit date: 08/11/2023 Discharge date: 08/12/2023  Admitted From: home Disposition:  home CODE STATUS: Full code  Discharge Instructions     Discharge instructions   Complete by: As directed    Your stool is positive for both Giardia lamblia and Norovirus.  For Giardia lamblia, please take Flagyl 250 mg 3 times a day for 7 days. [facility] Course:  For full details, please see H&P, progress notes, consult notes and ancillary notes.  Briefly,  Derek [name] is a 78 y.o. male with medical history significant of type 2 diabetes, HTN, presenting with vomiting and diarrhea, leukocytosis.   * Severe Sepsis (HCC) Meeting sepsis criteria w/ HR 115, WBC 20.  Lactate 4.5.  Source GI infection.  N/V/D 2/2 Giardia lamblia  Norovirus  --GI path pos for both.  Pt was discharged on Flagyl 250 mg TID for 7 days for Giardia lamblia infection.  Hx of Pulmonary embolism (HCC) On eliquis    Type 2 diabetes mellitus with hyperlipidemia (HCC) --A1c 7.3 --resume glipizide after discharge.  Mixed hyperlipidemia Cont home statin    Essential hypertension BP stable  --cont home regimen as below   Unless noted above, medications under "STOP" list are ones pt was not taking PTA.  Discharge Diagnoses:  Principal Problem:   Sepsis (HCC) Active Problems:   Essential hypertension   Mixed hyperlipidemia   Type 2 diabetes mellitus with hyperlipidemia (HCC)   Pulmonary embolism (HCC)   Leukocytosis   Malrotation colon   Gastroenteritis   30 Day Unplanned Readmission Risk Score    Flowsheet Row ED from 08/11/2023 in [facility] Emergency Department at [facility]  30 Day Unplanned Readmission Risk Score (%) 13.42 Filed at 08/12/2023 1200       This score is the patient's risk of an unplanned readmission within 30 days of being discharged (0 -100%). The score  is based on dignosis, age, lab data, medications, orders, and past utilization.   Low:  0-14.9   Medium: 15-21.9   High: 22-29.9   Extreme: 30 and above         Discharge Instructions:  Allergies as of 08/12/2023   No Known Allergies      Medication List     STOP taking these medications    escitalopram 5 MG tablet Commonly known as: LEXAPRO       TAKE these medications    atorvastatin 40 MG tablet Commonly known as: LIPITOR Take 20 mg by mouth every other day.   brompheniramine-pseudoephedrine-DM 30-2-10 MG/5ML syrup Take 5 mLs by mouth 4 (four) times daily as needed.   Eliquis 5 MG Tabs tablet Generic drug: apixaban Take 5 mg by mouth 2 (two) times daily. What changed: Another medication with the same name was removed. Continue taking this medication, and follow the directions you see here.   glipiZIDE 5 MG 24 hr tablet Commonly known as: GLUCOTROL XL Take 5 mg by mouth daily with breakfast.   lisinopril-hydrochlorothiazide 10-12.5 MG tablet Commonly known as: ZESTORETIC Take 1 tablet by mouth daily. What changed: Another medication with the same name was removed. Continue taking this medication, and follow the directions you see here.   metroNIDAZOLE 250 MG tablet Commonly known as: FLAGYL Take 1 tablet (250 mg total) by mouth 3 (three) times daily for 7 days.   traZODone 100 MG tablet Commonly known as:  DESYREL Take 1 tablet by mouth at bedtime.         Follow-up Information     Marisue Ivan, MD Follow up in 1 week(s).   Specialty: Family Medicine Contact information: [address] [facility] [place] [address] [phone]                 No Known Allergies   The results of significant diagnostics from this hospitalization (including imaging, microbiology, ancillary and laboratory) are listed below for reference.   Consultations:   Procedures/Studies: CT ABDOMEN PELVIS W CONTRAST Addendum Date:  08/11/2023 ADDENDUM REPORT: 08/11/2023 05:49 ADDENDUM: As noted in the body of the report, small bowel is concentrated in the right abdomen and pelvis with colon concentrated in the left abdomen and pelvis, features compatible with intestinal malrotation. Electronically Signed   By: Kennith Center M.D.   On: 08/11/2023 05:49   Result Date: 08/11/2023 CLINICAL DATA:  Flu like symptoms with abdominal pain. Nausea and vomiting. EXAM: CT ABDOMEN AND PELVIS WITH CONTRAST TECHNIQUE: Multidetector CT imaging of the abdomen and pelvis was performed using the standard protocol following bolus administration of intravenous contrast. RADIATION DOSE REDUCTION: This exam was performed according to the departmental dose-optimization program which includes automated exposure control, adjustment of the mA and/or kV according to patient size and/or use of iterative reconstruction technique. CONTRAST:  OMNIPAQUE IOHEXOL 300 MG/ML  SOLN COMPARISON:  Chest CTA 11/15/2022 FINDINGS: Lower chest: Subpleural reticulation in the visualized lung bases suggests component of underlying chronic lung disease. No pleural effusion. Hepatobiliary: The liver shows diffusely decreased attenuation suggesting fat deposition. 2.1 cm low-density lesion in the dome of the left liver is compatible with a cyst. No followup imaging is recommended. There is no evidence for gallstones, gallbladder wall thickening, or pericholecystic fluid. No intrahepatic or extrahepatic biliary dilation. Pancreas: No focal mass lesion. No dilatation of the main duct. No intraparenchymal cyst. No peripancreatic edema. Spleen: No splenomegaly. No suspicious focal mass lesion. Adrenals/Urinary Tract: No adrenal nodule or mass. Right kidney unremarkable. 6.9 cm exophytic cyst lower pole left kidney. No followup imaging is recommended. No evidence for hydroureter. Subtle bladder wall irregularity noted with right-sided bladder wall diverticulum. Features suspicious for  underlying component of bladder outlet obstruction. Stomach/Bowel: Stomach is moderately distended with fluid. Duodenum does not cross the midline as expected. Small bowel loops are concentrated in the right abdomen. Cecal tip is in the left lower quadrant with concentration of colonic anatomy in the left abdomen. No small bowel wall thickening or perienteric edema. The terminal ileum is normal. The appendix is normal. Colon is diffusely fluid-filled but nondilated and without evidence for colonic wall thickening. Scattered diverticula evident without diverticulitis. Fluid is visible in the rectum, a finding compatible with clinical diarrhea. Vascular/Lymphatic: There is mild atherosclerotic calcification of the abdominal aorta without aneurysm. There is no gastrohepatic or hepatoduodenal ligament lymphadenopathy. No retroperitoneal or mesenteric lymphadenopathy. Portal vein and superior mesenteric vein are patent. Splenic vein is patent. Celiac axis, SMA, and IMA are opacified normally No pelvic sidewall lymphadenopathy. Reproductive: Prostate gland is enlarged. Other: No intraperitoneal free fluid. Musculoskeletal: Small bilateral groin hernias contain only fat. No worrisome lytic or sclerotic osseous abnormality. Degenerative disc disease noted in the mid and lower lumbar spine. Bilateral pars interarticularis defects noted at L5. 6 mm anterolisthesis of L5 on S1 evident. IMPRESSION: 1. No small bowel dilatation. No small bowel wall thickening or perienteric edema. Diffusely fluid-filled colon without evidence for colonic wall thickening .  Fluid is visible in the rectum, a finding compatible with clinical diarrhea. 2. Hepatic steatosis. 3. Subtle bladder wall irregularity with right-sided bladder wall diverticulum. Features suspicious for underlying component of bladder outlet obstruction. 4. Prostatomegaly. 5. 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 6. Small bilateral  groin hernias contain only fat. 7.  Aortic Atherosclerosis (ICD10-I70.0). Electronically Signed: By: Kennith Center M.D. On: 08/11/2023 05:17   DG Chest 2 View Result Date: 08/11/2023 CLINICAL DATA:  Sepsis.  Flu like symptoms. EXAM: CHEST - 2 VIEW COMPARISON:  09/15/2022 FINDINGS: Elevation of the right diaphragm which is chronic. There is no edema, consolidation, effusion, or pneumothorax. Borderline heart size which is accentuated by mediastinal fat compared to most recent CT. Negative aortic and hilar contours. IMPRESSION: No evidence of active disease.  Stable from prior. Electronically Signed   By: Tiburcio Pea M.D.   On: 08/11/2023 04:28   DG Chest 2 View Result Date: 07/22/2023 CLINICAL DATA:  cough EXAM: CHEST - 2 VIEW COMPARISON:  Chest x-ray 09/15/2022, CT chest 09/15/2022 FINDINGS: The heart and mediastinal contours are unchanged. Atherosclerotic plaque. No focal consolidation. No pulmonary edema. No pleural effusion. No pneumothorax. No acute osseous abnormality. IMPRESSION: No active cardiopulmonary disease. Electronically Signed   By: Tish Frederickson M.D.   On: 07/22/2023 20:02      Labs: BNP (last 3 results) No results for input(s): "BNP" in the last 8760 hours. Basic Metabolic Panel: Recent Labs  Lab 08/11/23 0204 08/12/23 0905 08/12/23 1047  NA 138  --  136  K 4.2  --  3.5  CL 97*  --  100  CO2 20*  --  25  GLUCOSE 297*  --  205*  BUN 21  --  18  CREATININE 1.48*  --  1.17  CALCIUM 9.3  --  8.1*  MG  --  1.9  --    Liver Function Tests: Recent Labs  Lab 08/11/23 0204 08/12/23 1047  AST 33 22  ALT 20 15  ALKPHOS 62 36*  BILITOT 1.4* 1.3*  PROT 7.3 5.8*  ALBUMIN 4.2 3.2*   Recent Labs  Lab 08/11/23 0204  LIPASE 25   No results for input(s): "AMMONIA" in the last 168 hours. CBC: Recent Labs  Lab 08/11/23 0204 08/12/23 0905  WBC 20.0* 9.3  HGB 14.9 11.5*  HCT 44.0 33.5*  MCV 90.7 90.5  PLT 280 184   Cardiac Enzymes: No results for input(s):  "CKTOTAL", "CKMB", "CKMBINDEX", "TROPONINI" in the last 168 hours. BNP: Invalid input(s): "POCBNP" CBG: Recent Labs  Lab 08/11/23 1200 08/11/23 1935 08/12/23 0423 08/12/23 0816 08/12/23 1220  GLUCAP 159* 134* 132* 150* 173*   D-Dimer No results for input(s): "DDIMER" in the last 72 hours. Hgb A1c Recent Labs    08/11/23 0845  HGBA1C 7.3*   Lipid Profile No results for input(s): "CHOL", "HDL", "LDLCALC", "TRIG", "CHOLHDL", "LDLDIRECT" in the last 72 hours. Thyroid function studies No results for input(s): "TSH", "T4TOTAL", "T3FREE", "THYROIDAB" in the last 72 hours.  Invalid input(s): "FREET3" Anemia work up No results for input(s): "VITAMINB12", "FOLATE", "FERRITIN", "TIBC", "IRON", "RETICCTPCT" in the last 72 hours. Urinalysis    Component Value Date/Time   COLORURINE YELLOW (A) 08/11/2023 0636   APPEARANCEUR HAZY (A) 08/11/2023 0636   LABSPEC >1.046 (H) 08/11/2023 0636   PHURINE 5.0 08/11/2023 0636   GLUCOSEU 150 (A) 08/11/2023 0636   HGBUR NEGATIVE 08/11/2023 0636   BILIRUBINUR NEGATIVE 08/11/2023 0636   KETONESUR 5 (  A) 08/11/2023 0636   PROTEINUR NEGATIVE 08/11/2023 0636   NITRITE NEGATIVE 08/11/2023 0636   LEUKOCYTESUR MODERATE (A) 08/11/2023 0636   Sepsis Labs Recent Labs  Lab 08/11/23 0204 08/12/23 0905  WBC 20.0* 9.3   Microbiology Recent Results (from the past 240 hours)  Blood culture (routine x 2)     Status: None (Preliminary result)   Collection Time: 08/11/23  3:58 AM   Specimen: BLOOD  Result Value Ref Range Status   Specimen Description BLOOD BLOOD RIGHT ARM  Final   Special Requests Blood Culture adequate volume  Final   Culture   Final    NO GROWTH 1 DAY Performed at [facility], [address], [place], [place] [address]    Report Status PENDING  Incomplete  Blood culture (routine x 2)     Status: None (Preliminary result)   Collection Time: 08/11/23  3:58 AM   Specimen: BLOOD  Result Value Ref Range Status   Specimen  Description BLOOD BLOOD RIGHT ARM  Final   Special Requests Blood Culture adequate volume  Final   Culture   Final    NO GROWTH 1 DAY Performed at [facility], [address] [place], [place] [address]    Report Status PENDING  Incomplete  Resp panel by RT-PCR (RSV, Flu A&B, Covid) Anterior Nasal Swab     Status: None   Collection Time: 08/11/23  3:58 AM   Specimen: Anterior Nasal Swab  Result Value Ref Range Status   SARS Coronavirus 2 by RT PCR NEGATIVE NEGATIVE Final    Comment: (NOTE) SARS-CoV-2 target nucleic acids are NOT DETECTED.  The SARS-CoV-2 RNA is generally detectable in upper respiratory specimens during the acute phase of infection. The lowest concentration of SARS-CoV-2 viral copies this assay can detect is 138 copies/mL. A negative result does not preclude SARS-Cov-2 infection and should not be used as the sole basis for treatment or other patient management decisions. A negative result may occur with  improper specimen collection/handling, submission of specimen other than nasopharyngeal swab, presence of viral mutation(s) within the areas targeted by this assay, and inadequate number of viral copies(<138 copies/mL). A negative result must be combined with clinical observations, patient history, and epidemiological information. The expected result is Negative.  Fact Sheet for Patients:  BloggerCourse.com  Fact Sheet for Healthcare Providers:  SeriousBroker.it  This test is no t yet approved or cleared by the [place] FDA and  has been authorized for detection and/or diagnosis of SARS-CoV-2 by FDA under an Emergency Use Authorization (EUA). This EUA will remain  in effect (meaning this test can be used) for the duration of the COVID-19 declaration under Section 564(b)(1) of the Act, 21 U.S.C.section 360bbb-3(b)(1), unless the authorization is terminated  or revoked sooner.       Influenza A  by PCR NEGATIVE NEGATIVE Final   Influenza B by PCR NEGATIVE NEGATIVE Final    Comment: (NOTE) The Xpert Xpress SARS-CoV-2/FLU/RSV plus assay is intended as an aid in the diagnosis of influenza from Nasopharyngeal swab specimens and should not be used as a sole basis for treatment. Nasal washings and aspirates are unacceptable for Xpert Xpress SARS-CoV-2/FLU/RSV testing.  Fact Sheet for Patients: BloggerCourse.com  Fact Sheet for Healthcare Providers: SeriousBroker.it  This test is not yet approved or cleared by the [place] FDA and has been authorized for detection and/or diagnosis of SARS-CoV-2 by FDA under an Emergency Use Authorization (EUA). This EUA will remain in effect (meaning this test  can be used) for the duration of the COVID-19 declaration under Section 564(b)(1) of the Act, 21 U.S.C. section 360bbb-3(b)(1), unless the authorization is terminated or revoked.     Resp Syncytial Virus by PCR NEGATIVE NEGATIVE Final    Comment: (NOTE) Fact Sheet for Patients: BloggerCourse.com  Fact Sheet for Healthcare Providers: SeriousBroker.it  This test is not yet approved or cleared by the [place] FDA and has been authorized for detection and/or diagnosis of SARS-CoV-2 by FDA under an Emergency Use Authorization (EUA). This EUA will remain in effect (meaning this test can be used) for the duration of the COVID-19 declaration under Section 564(b)(1) of the Act, 21 U.S.C. section 360bbb-3(b)(1), unless the authorization is terminated or revoked.  Performed at [facility], [address] [place], [place] [address]   Urine Culture     Status: Abnormal (Preliminary result)   Collection Time: 08/11/23  6:35 AM   Specimen: Urine, Clean Catch  Result Value Ref Range Status   Specimen Description   Final    URINE, CLEAN CATCH Performed at [facility], [address], [place], [place] [address]    Special Requests   Final    NONE Performed at [facility], [address] [place], [place] [address]    Culture (A)  Final    50,000 COLONIES/mL ESCHERICHIA COLI SUSCEPTIBILITIES TO FOLLOW Performed at [facility], [address] [place], [place] [address]    Report Status PENDING  Incomplete  Gastrointestinal Panel by PCR , Stool     Status: Abnormal   Collection Time: 08/12/23 11:17 AM   Specimen: Stool  Result Value Ref Range Status   Campylobacter species NOT DETECTED NOT DETECTED Final   Plesimonas shigelloides NOT DETECTED NOT DETECTED Final   Salmonella species NOT DETECTED NOT DETECTED Final   Yersinia enterocolitica NOT DETECTED NOT DETECTED Final   Vibrio species NOT DETECTED NOT DETECTED Final   Vibrio cholerae NOT DETECTED NOT DETECTED Final   Enteroaggregative E coli (EAEC) NOT DETECTED NOT DETECTED Final   Enteropathogenic E coli (EPEC) NOT DETECTED NOT DETECTED Final   Enterotoxigenic E coli (ETEC) NOT DETECTED NOT DETECTED Final   Shiga like toxin producing E coli (STEC) NOT DETECTED NOT DETECTED Final   Shigella/Enteroinvasive E coli (EIEC) NOT DETECTED NOT DETECTED Final   Cryptosporidium NOT DETECTED NOT DETECTED Final   Cyclospora cayetanensis NOT DETECTED NOT DETECTED Final   Entamoeba histolytica NOT DETECTED NOT DETECTED Final   Giardia lamblia DETECTED (A) NOT DETECTED Final   Adenovirus F40/41 NOT DETECTED NOT DETECTED Final   Astrovirus NOT DETECTED NOT DETECTED Final   Norovirus GI/GII DETECTED (A) NOT DETECTED Final    Comment: RESULT CALLED TO, READ BACK BY AND VERIFIED WITH: [facility] NOTCH RN [id number] 08/12/23 HNM    Rotavirus A NOT DETECTED NOT DETECTED Final   Sapovirus (I, II, IV, and V) NOT DETECTED NOT DETECTED Final    Comment: Performed at [facility], [address] [place], [place] [address]  C Difficile Quick Screen w PCR reflex     Status: None   Collection  Time: 08/12/23 11:17 AM   Specimen: Stool  Result Value Ref Range Status   C Diff antigen NEGATIVE NEGATIVE Final   C Diff toxin NEGATIVE NEGATIVE Final   C Diff interpretation No C. difficile detected.  Final    Comment: Performed at [facility], [address] [place], [place] [address]     Total time spend  on discharging this patient, including the last patient exam, discussing the hospital stay, instructions for ongoing care as it relates to all pertinent caregivers, as well as preparing the medical discharge records, prescriptions, and/or referrals as applicable, is 45 minutes.    Darlin Priestly, MD  [facility] 08/12/2023, 3:34 PM

## 2023-08-12 NOTE — ED Notes (Signed)
Writer in Cpod area when pts family reported that pt had fallen while in bathroom. Pt found by writer sitting on buttocks in bathroom floor with blood droplets on floor and urine soiled pants partially down pts legs. Pt reported that he was attempting to sit on toilet when he fell onto floor, landing on his buttocks. Pt denies pain or injury. Pt alert and oriented x4. Pt assisted to standing position by staff and sat onto toilet to complete elimination. Pts soiled clothing removed and placed into belongings bag, handed over to family member present. IV removed during fall from pts left hand. IV site cleaned of blood and dressed with simple dressing. Pt placed into clean, dry brief and new non-slip socks in place. Pt placed into wheelchair and assisted back into bed inside of room 31. Monitoring devices re-applied, bed at lowest position, fall alarm initiated and call bell with in reach provided with education on need to press when need for elimination or other reason to exit bed. Full set of VS obtained and covering provider made aware.

## 2023-08-12 NOTE — Care Management CC44 (Signed)
Condition Code 44 Documentation Completed  Patient Details  Name: Derek [name] MRN: [id number] Date of Birth: 11-Aug-1945   Condition Code 44 given:  Yes Patient signature on Condition Code 44 notice:  Yes Documentation of 2 MD's agreement:  Yes Code 44 added to claim:  Yes    Erin Sons, LCSW 08/12/2023, 2:22 PM

## 2023-08-13 LAB — URINE CULTURE: Culture: 50000 — AB

## 2023-08-16 LAB — CULTURE, BLOOD (ROUTINE X 2)
Culture: NO GROWTH
Culture: NO GROWTH
Special Requests: ADEQUATE
Special Requests: ADEQUATE
